# Patient Record
Sex: Male | Born: 1950 | Race: White | Hispanic: No | Marital: Married | Smoking: Former smoker
Health system: Southern US, Community
[De-identification: ages and names within clinical notes are randomized; demographics above are authoritative.]

## PROBLEM LIST (undated history)

## (undated) DIAGNOSIS — E785 Hyperlipidemia, unspecified: Secondary | ICD-10-CM

## (undated) DIAGNOSIS — I10 Essential (primary) hypertension: Secondary | ICD-10-CM

## (undated) DIAGNOSIS — E119 Type 2 diabetes mellitus without complications: Secondary | ICD-10-CM

## (undated) DIAGNOSIS — C801 Malignant (primary) neoplasm, unspecified: Secondary | ICD-10-CM

## (undated) DIAGNOSIS — G4730 Sleep apnea, unspecified: Secondary | ICD-10-CM

## (undated) DIAGNOSIS — K259 Gastric ulcer, unspecified as acute or chronic, without hemorrhage or perforation: Secondary | ICD-10-CM

## (undated) DIAGNOSIS — E039 Hypothyroidism, unspecified: Secondary | ICD-10-CM

## (undated) DIAGNOSIS — Z87442 Personal history of urinary calculi: Secondary | ICD-10-CM

## (undated) DIAGNOSIS — M1990 Unspecified osteoarthritis, unspecified site: Secondary | ICD-10-CM

## (undated) HISTORY — DX: Sleep apnea, unspecified: G47.30

## (undated) HISTORY — PX: REPLACEMENT TOTAL KNEE: SUR1224

## (undated) HISTORY — DX: Essential (primary) hypertension: I10

## (undated) HISTORY — PX: HERNIA REPAIR: SHX51

## (undated) HISTORY — DX: Hyperlipidemia, unspecified: E78.5

## (undated) HISTORY — PX: TOTAL SHOULDER REPLACEMENT: SUR1217

## (undated) HISTORY — DX: Type 2 diabetes mellitus without complications: E11.9

---

## 2015-02-03 DIAGNOSIS — E780 Pure hypercholesterolemia: Secondary | ICD-10-CM | POA: Diagnosis not present

## 2015-02-03 DIAGNOSIS — E1365 Other specified diabetes mellitus with hyperglycemia: Secondary | ICD-10-CM | POA: Diagnosis not present

## 2015-02-03 DIAGNOSIS — I10 Essential (primary) hypertension: Secondary | ICD-10-CM | POA: Diagnosis not present

## 2015-02-03 DIAGNOSIS — E039 Hypothyroidism, unspecified: Secondary | ICD-10-CM | POA: Diagnosis not present

## 2015-02-14 DIAGNOSIS — B351 Tinea unguium: Secondary | ICD-10-CM | POA: Diagnosis not present

## 2015-02-14 DIAGNOSIS — Q667 Congenital pes cavus: Secondary | ICD-10-CM | POA: Diagnosis not present

## 2015-02-14 DIAGNOSIS — E119 Type 2 diabetes mellitus without complications: Secondary | ICD-10-CM | POA: Diagnosis not present

## 2015-03-14 DIAGNOSIS — B351 Tinea unguium: Secondary | ICD-10-CM | POA: Diagnosis not present

## 2015-03-14 DIAGNOSIS — Q667 Congenital pes cavus: Secondary | ICD-10-CM | POA: Diagnosis not present

## 2015-05-03 DIAGNOSIS — B351 Tinea unguium: Secondary | ICD-10-CM | POA: Diagnosis not present

## 2015-05-03 DIAGNOSIS — B353 Tinea pedis: Secondary | ICD-10-CM | POA: Diagnosis not present

## 2015-05-03 DIAGNOSIS — M722 Plantar fascial fibromatosis: Secondary | ICD-10-CM | POA: Diagnosis not present

## 2015-05-05 DIAGNOSIS — E780 Pure hypercholesterolemia: Secondary | ICD-10-CM | POA: Diagnosis not present

## 2015-05-05 DIAGNOSIS — E119 Type 2 diabetes mellitus without complications: Secondary | ICD-10-CM | POA: Diagnosis not present

## 2015-05-05 DIAGNOSIS — I10 Essential (primary) hypertension: Secondary | ICD-10-CM | POA: Diagnosis not present

## 2015-05-05 DIAGNOSIS — E1365 Other specified diabetes mellitus with hyperglycemia: Secondary | ICD-10-CM | POA: Diagnosis not present

## 2015-05-18 DIAGNOSIS — B353 Tinea pedis: Secondary | ICD-10-CM | POA: Diagnosis not present

## 2015-05-18 DIAGNOSIS — B351 Tinea unguium: Secondary | ICD-10-CM | POA: Diagnosis not present

## 2015-05-18 DIAGNOSIS — M722 Plantar fascial fibromatosis: Secondary | ICD-10-CM | POA: Diagnosis not present

## 2015-06-13 DIAGNOSIS — M722 Plantar fascial fibromatosis: Secondary | ICD-10-CM | POA: Diagnosis not present

## 2015-09-19 DIAGNOSIS — H2511 Age-related nuclear cataract, right eye: Secondary | ICD-10-CM | POA: Diagnosis not present

## 2015-09-19 DIAGNOSIS — E119 Type 2 diabetes mellitus without complications: Secondary | ICD-10-CM | POA: Diagnosis not present

## 2015-09-19 DIAGNOSIS — Z961 Presence of intraocular lens: Secondary | ICD-10-CM | POA: Diagnosis not present

## 2015-09-26 DIAGNOSIS — E039 Hypothyroidism, unspecified: Secondary | ICD-10-CM | POA: Diagnosis not present

## 2015-09-26 DIAGNOSIS — Z23 Encounter for immunization: Secondary | ICD-10-CM | POA: Diagnosis not present

## 2015-09-26 DIAGNOSIS — Z0000 Encounter for general adult medical examination without abnormal findings: Secondary | ICD-10-CM | POA: Diagnosis not present

## 2015-09-26 DIAGNOSIS — Z6841 Body Mass Index (BMI) 40.0 and over, adult: Secondary | ICD-10-CM | POA: Diagnosis not present

## 2015-09-26 DIAGNOSIS — I10 Essential (primary) hypertension: Secondary | ICD-10-CM | POA: Diagnosis not present

## 2015-09-26 DIAGNOSIS — Z1211 Encounter for screening for malignant neoplasm of colon: Secondary | ICD-10-CM | POA: Diagnosis not present

## 2015-09-26 DIAGNOSIS — Z794 Long term (current) use of insulin: Secondary | ICD-10-CM | POA: Diagnosis not present

## 2015-09-26 DIAGNOSIS — E119 Type 2 diabetes mellitus without complications: Secondary | ICD-10-CM | POA: Diagnosis not present

## 2015-09-27 ENCOUNTER — Other Ambulatory Visit: Payer: Self-pay | Admitting: Family Medicine

## 2015-09-27 ENCOUNTER — Other Ambulatory Visit (HOSPITAL_COMMUNITY): Payer: Self-pay | Admitting: Family Medicine

## 2015-09-27 DIAGNOSIS — Z122 Encounter for screening for malignant neoplasm of respiratory organs: Secondary | ICD-10-CM

## 2015-09-27 DIAGNOSIS — Z136 Encounter for screening for cardiovascular disorders: Secondary | ICD-10-CM

## 2015-09-27 DIAGNOSIS — F17211 Nicotine dependence, cigarettes, in remission: Secondary | ICD-10-CM

## 2015-10-06 DIAGNOSIS — Z1211 Encounter for screening for malignant neoplasm of colon: Secondary | ICD-10-CM | POA: Diagnosis not present

## 2015-10-18 ENCOUNTER — Ambulatory Visit (HOSPITAL_COMMUNITY): Payer: Medicare Other

## 2015-11-02 DIAGNOSIS — M722 Plantar fascial fibromatosis: Secondary | ICD-10-CM | POA: Diagnosis not present

## 2015-11-02 DIAGNOSIS — Q667 Congenital pes cavus: Secondary | ICD-10-CM | POA: Diagnosis not present

## 2015-11-02 DIAGNOSIS — E1351 Other specified diabetes mellitus with diabetic peripheral angiopathy without gangrene: Secondary | ICD-10-CM | POA: Diagnosis not present

## 2015-11-07 ENCOUNTER — Ambulatory Visit (HOSPITAL_COMMUNITY)
Admission: RE | Admit: 2015-11-07 | Discharge: 2015-11-07 | Disposition: A | Discharge status: Home / Self Care | Payer: Medicare Other | Source: Ambulatory Visit | Attending: Family Medicine | Admitting: Family Medicine

## 2015-11-07 DIAGNOSIS — Z122 Encounter for screening for malignant neoplasm of respiratory organs: Secondary | ICD-10-CM | POA: Diagnosis not present

## 2015-11-07 DIAGNOSIS — R911 Solitary pulmonary nodule: Secondary | ICD-10-CM | POA: Insufficient documentation

## 2015-11-07 DIAGNOSIS — Z87891 Personal history of nicotine dependence: Secondary | ICD-10-CM | POA: Diagnosis not present

## 2015-11-07 DIAGNOSIS — F17211 Nicotine dependence, cigarettes, in remission: Secondary | ICD-10-CM

## 2015-11-13 ENCOUNTER — Ambulatory Visit
Admission: RE | Admit: 2015-11-13 | Discharge: 2015-11-13 | Disposition: A | Discharge status: Home / Self Care | Payer: Medicare Other | Source: Ambulatory Visit | Attending: Family Medicine | Admitting: Family Medicine

## 2015-11-13 DIAGNOSIS — Z136 Encounter for screening for cardiovascular disorders: Secondary | ICD-10-CM

## 2015-11-14 DIAGNOSIS — G4733 Obstructive sleep apnea (adult) (pediatric): Secondary | ICD-10-CM | POA: Diagnosis not present

## 2015-11-30 DIAGNOSIS — E119 Type 2 diabetes mellitus without complications: Secondary | ICD-10-CM | POA: Diagnosis not present

## 2015-11-30 DIAGNOSIS — Z5181 Encounter for therapeutic drug level monitoring: Secondary | ICD-10-CM | POA: Diagnosis not present

## 2015-11-30 DIAGNOSIS — Z6841 Body Mass Index (BMI) 40.0 and over, adult: Secondary | ICD-10-CM | POA: Diagnosis not present

## 2015-11-30 DIAGNOSIS — Z713 Dietary counseling and surveillance: Secondary | ICD-10-CM | POA: Diagnosis not present

## 2016-04-11 ENCOUNTER — Ambulatory Visit: Payer: Medicare Other | Admitting: *Deleted

## 2016-04-23 ENCOUNTER — Ambulatory Visit: Payer: Medicare Other | Admitting: Dietician

## 2016-05-09 ENCOUNTER — Encounter: Payer: Self-pay | Admitting: Dietician

## 2016-05-09 ENCOUNTER — Encounter: Payer: Medicare Other | Attending: Family Medicine | Admitting: Dietician

## 2016-05-09 VITALS — Ht 70.0 in | Wt 304.4 lb

## 2016-05-09 DIAGNOSIS — E119 Type 2 diabetes mellitus without complications: Secondary | ICD-10-CM | POA: Diagnosis not present

## 2016-05-09 NOTE — Patient Instructions (Signed)
Goals:  Follow Diabetes Meal Plan as instructed  Eat 3 meals and 2 snacks, every 3-5 hrs  Limit carbohydrate intake to 45-60 grams carbohydrate/meal  Limit carbohydrate intake to 0-15 grams carbohydrate/snack  Aim to have half of your plate vegetables   Protein: size of the palm of your hand, Starch: what you hold in your hand  Add lean protein foods to meals/snacks  Monitor glucose levels as instructed by your doctor (try once a day and look for patterns)  Continue to 30 mins or of physical activity daily

## 2016-05-09 NOTE — Progress Notes (Signed)
Diabetes Self-Management Education  Visit Type: First/Initial  Appt. Start Time: 210 Appt. End Time: 330  05/09/2016  Dean Zimmerman, identified by name and date of birth, is a 65 y.o. male with a diagnosis of Diabetes: Type 2.   ASSESSMENT  Height 5\' 10"  (1.778 m), weight 304 lb 6.4 oz (138.075 kg). Body mass index is 43.68 kg/(m^2).      Diabetes Self-Management Education - 05/09/16 1604    Psychosocial Assessment   Learning Readiness Ready   Pre-Education Assessment   Patient understands the diabetes disease and treatment process. Needs Instruction   Patient understands incorporating nutritional management into lifestyle. Needs Instruction   Patient undertands incorporating physical activity into lifestyle. Needs Instruction   Patient understands using medications safely. Needs Instruction   Patient understands monitoring blood glucose, interpreting and using results Needs Review   Patient understands prevention, detection, and treatment of acute complications. Needs Review   Patient understands prevention, detection, and treatment of chronic complications. Needs Review   Patient understands how to develop strategies to address psychosocial issues. Demonstrates understanding / competency   Patient understands how to develop strategies to promote health/change behavior. Demonstrates understanding / competency   Patient Education   Disease state  Definition of diabetes, type 1 and 2, and the diagnosis of diabetes;Factors that contribute to the development of diabetes   Nutrition management  Food label reading, portion sizes and measuring food.   Physical activity and exercise  Role of exercise on diabetes management, blood pressure control and cardiac health.   Medications Reviewed patients medication for diabetes, action, purpose, timing of dose and side effects.   Monitoring Interpreting lab values - A1C, lipid, urine microalbumina.;Yearly dilated eye exam;Daily foot  exams;Identified appropriate SMBG and/or A1C goals.   Acute complications Taught treatment of hypoglycemia - the 15 rule.   Chronic complications Relationship between chronic complications and blood glucose control   Psychosocial adjustment Role of stress on diabetes   Individualized Goals (developed by patient)   Nutrition Follow meal plan discussed   Physical Activity Exercise 5-7 days per week   Medications take my medication as prescribed   Monitoring  test my blood glucose as discussed   Reducing Risk examine blood glucose patterns;treat hypoglycemia with 15 grams of carbs if blood glucose less than 70mg /dL   Post-Education Assessment   Patient understands the diabetes disease and treatment process. Demonstrates understanding / competency   Patient understands incorporating nutritional management into lifestyle. Demonstrates understanding / competency   Patient undertands incorporating physical activity into lifestyle. Demonstrates understanding / competency   Patient understands using medications safely. Demonstrates understanding / competency   Patient understands monitoring blood glucose, interpreting and using results Demonstrates understanding / competency   Patient understands prevention, detection, and treatment of acute complications. Demonstrates understanding / competency   Patient understands prevention, detection, and treatment of chronic complications. Demonstrates understanding / competency   Patient understands how to develop strategies to address psychosocial issues. Demonstrates understanding / competency   Patient understands how to develop strategies to promote health/change behavior. Demonstrates understanding / competency   Outcomes   Expected Outcomes Demonstrated interest in learning. Expect positive outcomes   Future DMSE PRN   Program Status Completed      Individualized Plan for Diabetes Self-Management Training:   Learning Objective:  Patient will have a  greater understanding of diabetes self-management. Patient education plan is to attend individual and/or group sessions per assessed needs and concerns.   Plan:   Patient Instructions  Goals:  Follow Diabetes Meal Plan as instructed  Eat 3 meals and 2 snacks, every 3-5 hrs  Limit carbohydrate intake to 45-60 grams carbohydrate/meal  Limit carbohydrate intake to 0-15 grams carbohydrate/snack  Aim to have half of your plate vegetables   Protein: size of the palm of your hand, Starch: what you hold in your hand  Add lean protein foods to meals/snacks  Monitor glucose levels as instructed by your doctor (try once a day and look for patterns)  Continue to 30 mins or of physical activity daily    Expected Outcomes:  Demonstrated interest in learning. Expect positive outcomes  Education material provided: Living Well with Diabetes, Meal plan card and Snack sheet  If problems or questions, patient to contact team via:  Phone  Future DSME appointment: PRN

## 2016-09-24 DIAGNOSIS — E119 Type 2 diabetes mellitus without complications: Secondary | ICD-10-CM | POA: Diagnosis not present

## 2016-09-24 DIAGNOSIS — H524 Presbyopia: Secondary | ICD-10-CM | POA: Diagnosis not present

## 2016-09-24 DIAGNOSIS — H2511 Age-related nuclear cataract, right eye: Secondary | ICD-10-CM | POA: Diagnosis not present

## 2016-11-07 ENCOUNTER — Other Ambulatory Visit: Payer: Self-pay | Admitting: Family Medicine

## 2016-11-07 DIAGNOSIS — R911 Solitary pulmonary nodule: Secondary | ICD-10-CM

## 2016-11-27 ENCOUNTER — Inpatient Hospital Stay: Admission: RE | Admit: 2016-11-27 | Payer: Medicare Other | Source: Ambulatory Visit

## 2016-12-10 ENCOUNTER — Ambulatory Visit
Admission: RE | Admit: 2016-12-10 | Discharge: 2016-12-10 | Disposition: A | Discharge status: Home / Self Care | Payer: Medicare Other | Source: Ambulatory Visit | Attending: Family Medicine | Admitting: Family Medicine

## 2016-12-10 DIAGNOSIS — R911 Solitary pulmonary nodule: Secondary | ICD-10-CM

## 2017-09-22 ENCOUNTER — Ambulatory Visit
Admission: RE | Admit: 2017-09-22 | Discharge: 2017-09-22 | Disposition: A | Discharge status: Home / Self Care | Payer: Worker's Compensation | Source: Ambulatory Visit | Attending: Orthopedic Surgery | Admitting: Orthopedic Surgery

## 2017-09-22 ENCOUNTER — Other Ambulatory Visit: Payer: Self-pay | Admitting: Orthopedic Surgery

## 2017-09-22 DIAGNOSIS — Z01818 Encounter for other preprocedural examination: Secondary | ICD-10-CM

## 2018-01-07 ENCOUNTER — Ambulatory Visit
Admission: RE | Admit: 2018-01-07 | Discharge: 2018-01-07 | Disposition: A | Discharge status: Home / Self Care | Payer: Worker's Compensation | Source: Ambulatory Visit | Attending: Orthopedic Surgery | Admitting: Orthopedic Surgery

## 2018-01-07 ENCOUNTER — Other Ambulatory Visit: Payer: Self-pay | Admitting: Orthopedic Surgery

## 2018-01-07 DIAGNOSIS — M25511 Pain in right shoulder: Secondary | ICD-10-CM

## 2018-10-19 ENCOUNTER — Other Ambulatory Visit (HOSPITAL_COMMUNITY): Payer: Self-pay

## 2018-10-26 ENCOUNTER — Inpatient Hospital Stay: Admit: 2018-10-26 | Payer: Self-pay | Admitting: Orthopedic Surgery

## 2018-10-26 SURGERY — ARTHROPLASTY, KNEE, TOTAL
Anesthesia: Choice | Site: Knee | Laterality: Left

## 2019-12-15 ENCOUNTER — Ambulatory Visit
Admission: RE | Admit: 2019-12-15 | Discharge: 2019-12-15 | Disposition: A | Discharge status: Home / Self Care | Payer: Self-pay | Source: Ambulatory Visit | Attending: Orthopedic Surgery | Admitting: Orthopedic Surgery

## 2019-12-15 ENCOUNTER — Other Ambulatory Visit: Payer: Self-pay | Admitting: Orthopedic Surgery

## 2019-12-15 DIAGNOSIS — M25111 Fistula, right shoulder: Secondary | ICD-10-CM

## 2019-12-15 DIAGNOSIS — M19011 Primary osteoarthritis, right shoulder: Secondary | ICD-10-CM

## 2020-01-28 ENCOUNTER — Ambulatory Visit: Payer: PRIVATE HEALTH INSURANCE

## 2020-02-05 ENCOUNTER — Ambulatory Visit: Payer: Medicare Other

## 2020-02-18 ENCOUNTER — Ambulatory Visit: Payer: PRIVATE HEALTH INSURANCE

## 2020-10-09 ENCOUNTER — Other Ambulatory Visit: Payer: Self-pay | Admitting: Family Medicine

## 2020-10-09 DIAGNOSIS — Z122 Encounter for screening for malignant neoplasm of respiratory organs: Secondary | ICD-10-CM

## 2020-10-09 DIAGNOSIS — Z87891 Personal history of nicotine dependence: Secondary | ICD-10-CM

## 2020-10-25 ENCOUNTER — Other Ambulatory Visit: Payer: Self-pay

## 2020-10-25 ENCOUNTER — Ambulatory Visit
Admission: RE | Admit: 2020-10-25 | Discharge: 2020-10-25 | Disposition: A | Discharge status: Home / Self Care | Payer: Medicare Other | Source: Ambulatory Visit | Attending: Family Medicine | Admitting: Family Medicine

## 2020-10-25 DIAGNOSIS — Z87891 Personal history of nicotine dependence: Secondary | ICD-10-CM

## 2020-10-25 DIAGNOSIS — Z122 Encounter for screening for malignant neoplasm of respiratory organs: Secondary | ICD-10-CM

## 2020-12-19 IMAGING — CT CT CHEST LUNG CANCER SCREENING LOW DOSE W/O CM
1 series · 15 of 31 positions shown, 19 images · non-contrast
Comparison: 12/10/2016 diagnostic chest CT. Lung cancer screening
CT of 11/07/2015.

CLINICAL DATA: Sixty pack-year smoking history, quitting 10 years
ago.

EXAM:
CT CHEST WITHOUT CONTRAST LOW-DOSE FOR LUNG CANCER SCREENING
TECHNIQUE: Multidetector CT imaging of the chest was performed following the
standard protocol without IV contrast.

[Series 2: ldct screen -soft · axial · 0.94mm/px · z∈[-358,-28]mm · 15 of 72 slices shown, 19 images]
[im 3/72  mediastinal]
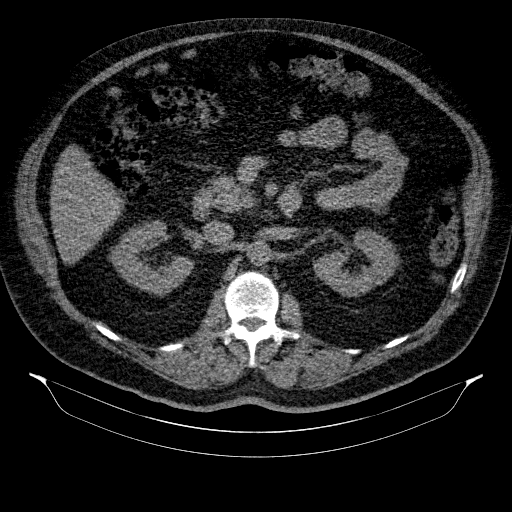
[im 3/72  lung]
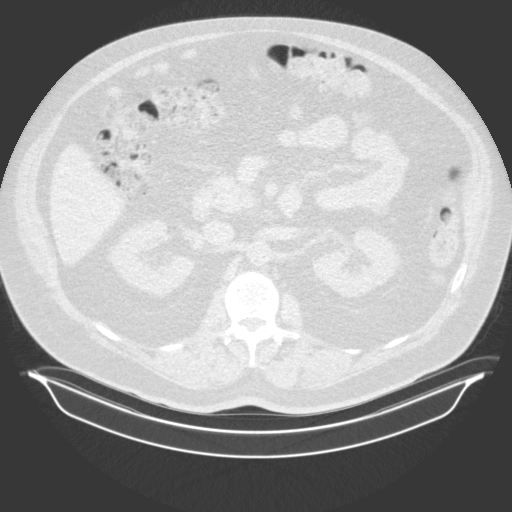
[im 8/72  lung]
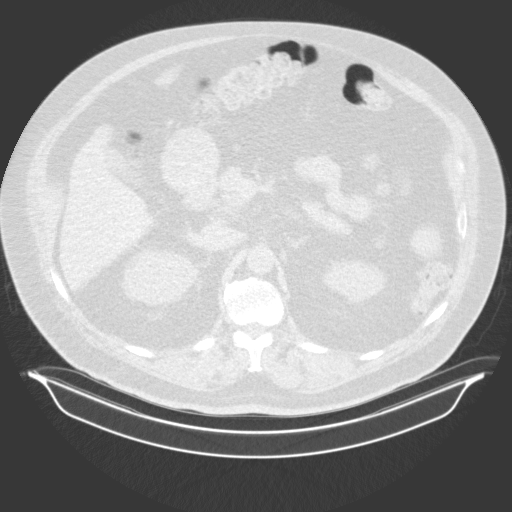
[im 14/72  lung]
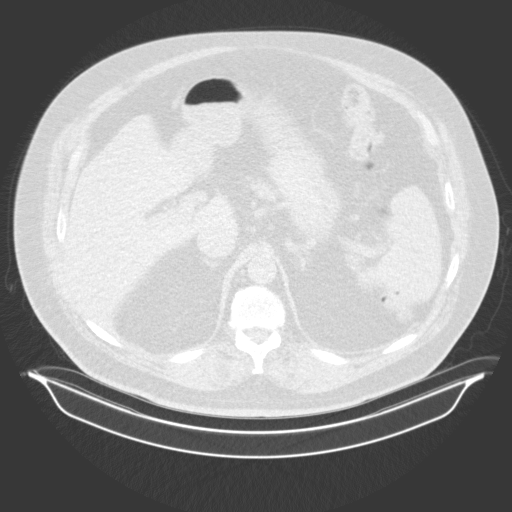
[im 16/72  lung]
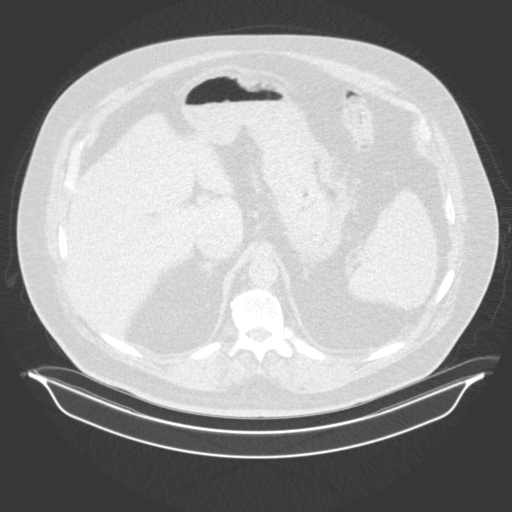
[im 22/72  mediastinal]
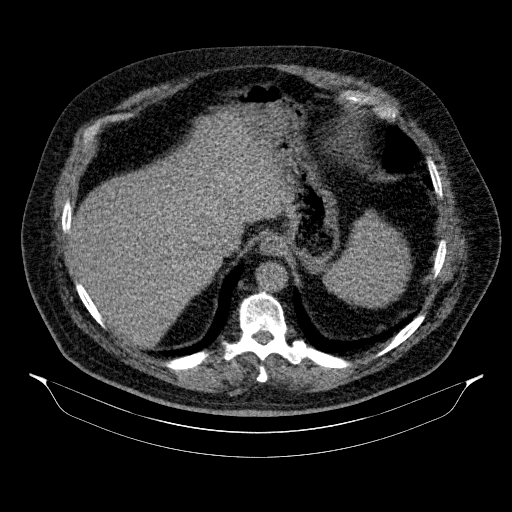
[im 22/72  lung]
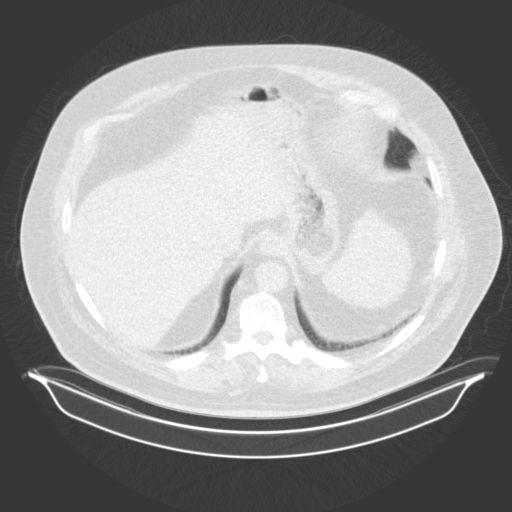
[im 27/72  lung]
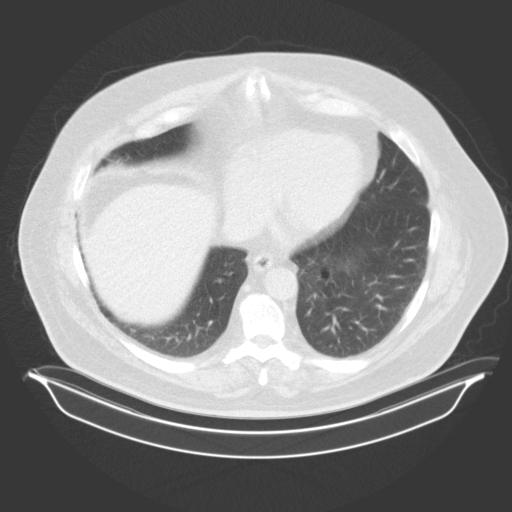
[im 32/72  lung]
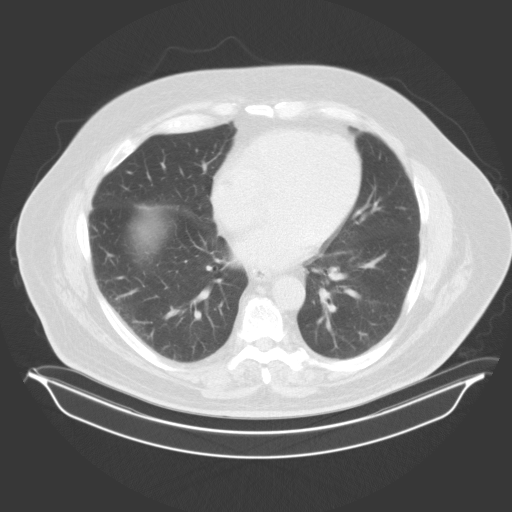
[im 37/72  lung]
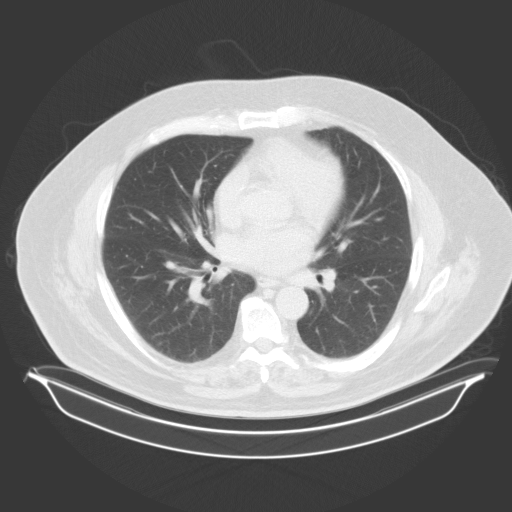
[im 40/72  mediastinal]
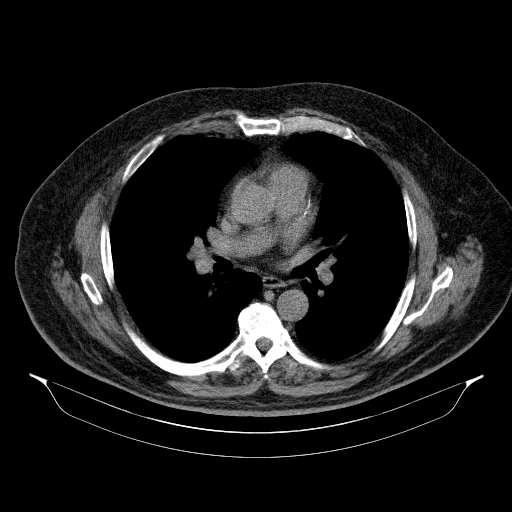
[im 40/72  lung]
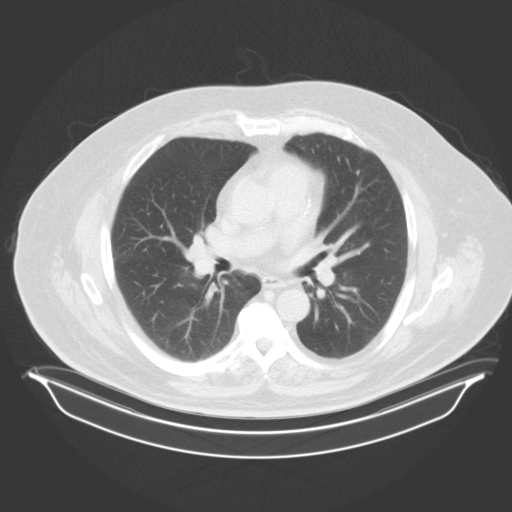
[im 45/72  lung]
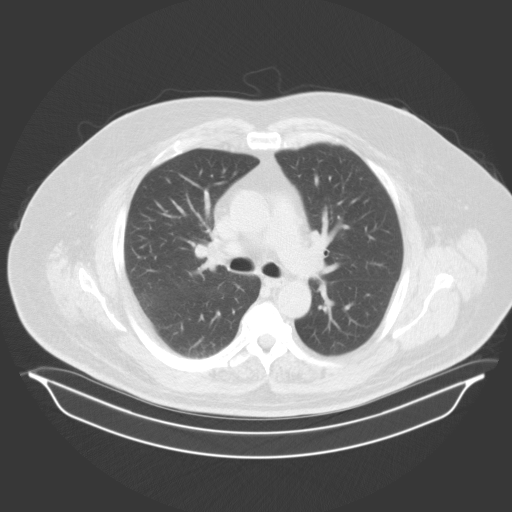
[im 50/72  lung]
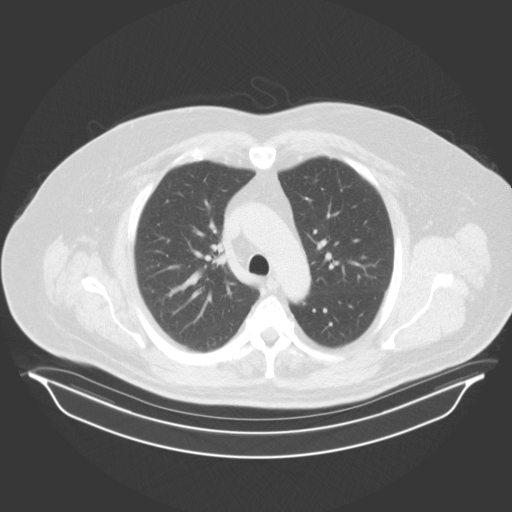
[im 56/72  lung]
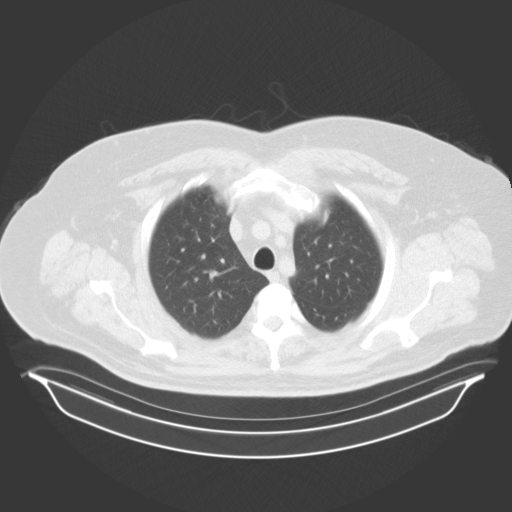
[im 58/72  mediastinal]
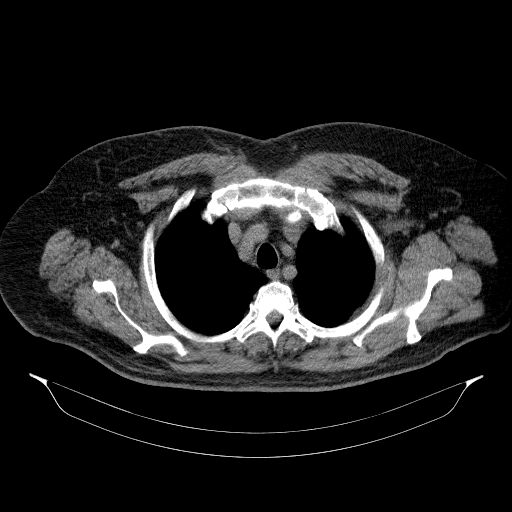
[im 58/72  lung]
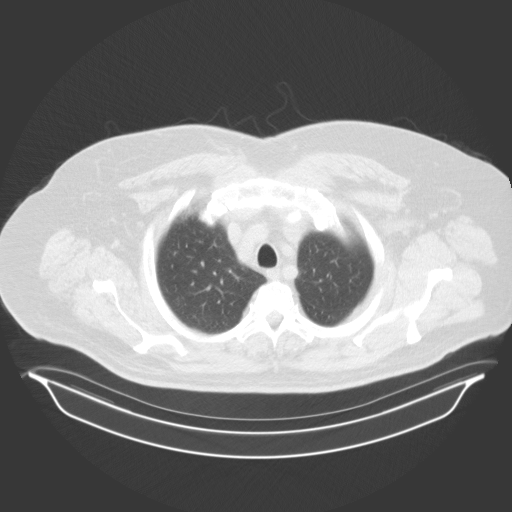
[im 64/72  lung]
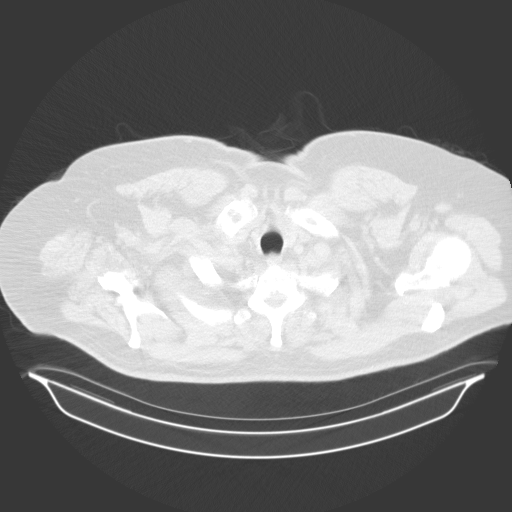
[im 69/72  lung]
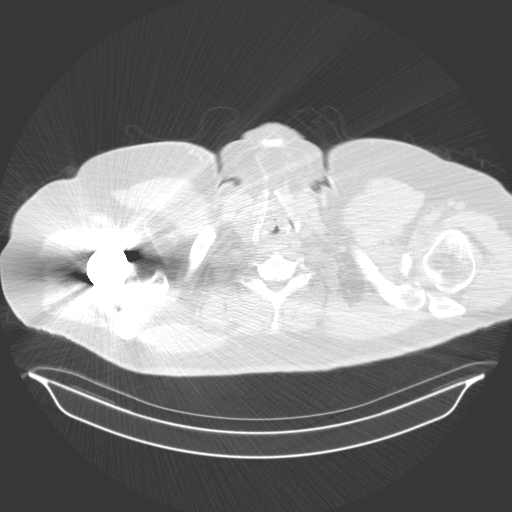

[15 of 31 positions shown; findings below may reference images not displayed]

FINDINGS: Cardiovascular: Aortic atherosclerosis. Normal heart size, without
pericardial effusion. Multivessel coronary artery atherosclerosis.

Mediastinum/Nodes: No mediastinal or definite hilar adenopathy,
given limitations of unenhanced CT.

Lungs/Pleura: No pleural fluid. Mild centrilobular emphysema.
Isolated right middle lobe pulmonary nodule is unchanged at volume
derived equivalent diameter 2.4 mm.

Upper Abdomen: Normal imaged portions of the liver, spleen, stomach,
pancreas, gallbladder, biliary tract, adrenal glands, kidneys.

Musculoskeletal: Right shoulder arthroplasty. Upper thoracic
spondylosis.
IMPRESSION: 1. Lung-RADS 2, benign appearance or behavior. Continue annual
screening with low-dose chest CT without contrast in 12 months.
2. Aortic Atherosclerosis (E3QMW-T8C.C) and Emphysema (E3QMW-T58.M).

## 2021-05-28 ENCOUNTER — Telehealth (HOSPITAL_COMMUNITY): Payer: Self-pay

## 2021-05-28 ENCOUNTER — Other Ambulatory Visit: Payer: Self-pay

## 2021-05-28 ENCOUNTER — Encounter (HOSPITAL_COMMUNITY): Payer: Self-pay

## 2021-05-28 ENCOUNTER — Ambulatory Visit (HOSPITAL_COMMUNITY)
Admission: EM | Admit: 2021-05-28 | Discharge: 2021-05-28 | Disposition: A | Discharge status: Home / Self Care | Payer: Medicare Other

## 2021-05-28 DIAGNOSIS — E1169 Type 2 diabetes mellitus with other specified complication: Secondary | ICD-10-CM

## 2021-05-28 DIAGNOSIS — Z8719 Personal history of other diseases of the digestive system: Secondary | ICD-10-CM | POA: Diagnosis not present

## 2021-05-28 DIAGNOSIS — R112 Nausea with vomiting, unspecified: Secondary | ICD-10-CM | POA: Diagnosis not present

## 2021-05-28 DIAGNOSIS — A059 Bacterial foodborne intoxication, unspecified: Secondary | ICD-10-CM

## 2021-05-28 DIAGNOSIS — Z794 Long term (current) use of insulin: Secondary | ICD-10-CM

## 2021-05-28 MED ORDER — ONDANSETRON 8 MG PO TBDP: 8.0000 mg | ORAL_TABLET | Freq: Three times a day (TID) | ORAL | 0 refills | Status: DC | PRN

## 2021-05-28 MED ORDER — ONDANSETRON 4 MG PO TBDP
8.0000 mg | ORAL_TABLET | Freq: Once | ORAL | Status: AC
  Administered 2021-05-28: 8 mg via ORAL

## 2021-05-28 MED ORDER — ONDANSETRON 4 MG PO TBDP
ORAL_TABLET | ORAL | Status: AC
  Filled 2021-05-28: qty 2

## 2021-05-28 NOTE — ED Provider Notes (Addendum)
[facility]    CSN: [id number] Arrival date & time: 05/28/21  1528      History   Chief Complaint Chief Complaint  Patient presents with  . Abdominal Pain  . Emesis  . Diarrhea    HPI Dean Zimmerman is a 70 y.o. male presenting with GI symptoms x1 day following eating at restaurant. Medical history diabetes, hypertension, hyperlipidemia, sleep apnea, SBO.  States he has had 8 episodes of vomiting and 3 episodes of diarrhea in the last 24 hours.  Nausea.  Able to keep some fluids and food down.  Pepto-Bismol providing little relief.  Generalized crampy abdominal pain.  States he is concerned that he has another bowel obstruction.  Last bowel movement was 10 minutes ago and was normal. Does state vomit had a brown appearance following taking pepto bismal. Denies hematemesis, vomiting of fecal matter, melena, hematochezia.  Denies URI symptoms including fever/chills, cough, congestion, body aches.  Denies chest pain, shortness of breath, dizziness, weakness.  For diabetes, he is taking medications as directed.  Sugars running 150s today nonfasting.  HPI  Past Medical History:  Diagnosis Date  . Diabetes mellitus without complication ([place])   . Hyperlipidemia   . Hypertension   . Sleep apnea     There are no problems to display for this patient.   History reviewed. No pertinent surgical history.     Home Medications    Prior to Admission medications   Medication Sig Start Date End Date Taking? Authorizing Provider  celecoxib (CELEBREX) 200 MG capsule Take 200 mg by mouth 2 (two) times daily.   Yes [provider]  escitalopram (LEXAPRO) 10 MG tablet Take 10 mg by mouth daily.   Yes [provider]  Latanoprost 0.005 % EMUL Apply to eye.   Yes [provider]  metFORMIN (GLUCOPHAGE) 1000 MG tablet Take 1,000 mg by mouth 2 (two) times daily with a meal.   Yes [provider]  omeprazole (PRILOSEC) 40 MG capsule Take 40 mg by mouth  daily.   Yes [provider]  ondansetron (ZOFRAN ODT) 8 MG disintegrating tablet Take 1 tablet (8 mg total) by mouth every 8 (eight) hours as needed for nausea or vomiting. 05/28/21  Yes Hazel Sams, PA-C  Semaglutide (OZEMPIC, 0.25 OR 0.5 MG/DOSE, [place]) Inject into the skin.   Yes [provider]  aspirin 81 MG tablet Take 81 mg by mouth daily.    [provider]  atorvastatin (LIPITOR) 20 MG tablet Take 20 mg by mouth daily.    [provider]  Cholecalciferol (VITAMIN D) 2000 units CAPS Take 4,000 Units by mouth.    [provider]  enalapril (VASOTEC) 10 MG tablet Take 10 mg by mouth daily.    [provider]  glipiZIDE (GLUCOTROL) 10 MG tablet Take 10 mg by mouth daily before breakfast.    [provider]  insulin glargine (LANTUS) 100 UNIT/ML injection Inject 55 Units into the skin at bedtime.     [provider]  levothyroxine (SYNTHROID, LEVOTHROID) 150 MCG tablet Take 150 mcg by mouth daily before breakfast.    [provider]  Liraglutide (VICTOZA) 18 MG/3ML SOPN Inject into the skin.    [provider]  pioglitazone (ACTOS) 30 MG tablet Take 30 mg by mouth daily.    [provider]  Tavaborole (KERYDIN) 5 % SOLN Apply topically.    [provider]    Family History Family History  Family history unknown: Yes  Social History     Allergies   Hydrocodone and Oxycontin [oxycodone hcl]   Review of Systems Review of Systems  Constitutional: Negative for appetite change, chills, diaphoresis, fever and unexpected weight change.  HENT: Negative for congestion, ear pain, sinus pressure, sinus pain, sneezing, sore throat and trouble swallowing.   Respiratory: Negative for cough, chest tightness and shortness of breath.   Cardiovascular: Negative for chest pain.  Gastrointestinal: Positive for abdominal pain, diarrhea, nausea and vomiting. Negative for abdominal distention,  anal bleeding, blood in stool, constipation and rectal pain.  Genitourinary: Negative for dysuria, flank pain, frequency and urgency.  Musculoskeletal: Negative for back pain and myalgias.  Neurological: Negative for dizziness, light-headedness and headaches.  All other systems reviewed and are negative.    Physical Exam Triage Vital Signs ED Triage Vitals  Enc Vitals Group     BP      Pulse      Resp      Temp      Temp src      SpO2      Weight      Height      Head Circumference      Peak Flow      Pain Score      Pain Loc      Pain Edu?      Excl. in [place]?    No data found.  Updated Vital Signs BP (!) 146/72 (BP Location: Right Arm)   Pulse 70   Temp 99 F (37.2 C) (Oral)   Resp 17   SpO2 98%   Visual Acuity Right Eye Distance:   Left Eye Distance:   Bilateral Distance:    Right Eye Near:   Left Eye Near:    Bilateral Near:     Physical Exam Vitals reviewed.  Constitutional:      General: He is not in acute distress.    Appearance: Normal appearance. He is obese. He is not ill-appearing.  HENT:     Head: Normocephalic and atraumatic.     Mouth/Throat:     Mouth: Mucous membranes are moist.     Comments: Moist mucous membranes Eyes:     Extraocular Movements: Extraocular movements intact.     Pupils: Pupils are equal, round, and reactive to light.  Cardiovascular:     Rate and Rhythm: Normal rate and regular rhythm.     Heart sounds: Normal heart sounds.  Pulmonary:     Effort: Pulmonary effort is normal.     Breath sounds: Normal breath sounds. No wheezing, rhonchi or rales.  Abdominal:     General: Bowel sounds are increased. There is no distension.     Palpations: Abdomen is soft. There is no mass.     Tenderness: There is generalized abdominal tenderness. There is no right CVA tenderness, left CVA tenderness, guarding or rebound. Negative signs include Murphy's sign, Rovsing's sign and McBurney's sign.     Hernia: No hernia is present.      Comments: BS full throughout. Generalized TTP. No focal pain. No abdominal mass.  Skin:    General: Skin is warm.     Capillary Refill: Capillary refill takes less than 2 seconds.     Comments: Good skin turgor  Neurological:     General: No focal deficit present.     Mental Status: He is alert and oriented to person, place, and time.  Psychiatric:        Mood and Affect: Mood normal.  Behavior: Behavior normal.      UC Treatments / Results  Labs (all labs ordered are listed, but only abnormal results are displayed) Labs Reviewed - No data to display  EKG   Radiology No results found.  Procedures Procedures (including critical care time)  Medications Ordered in UC Medications  ondansetron (ZOFRAN-ODT) disintegrating tablet 8 mg (has no administration in time range)    Initial Impression / Assessment and Plan / UC Course  I have reviewed the triage vital signs and the nursing notes.  Pertinent labs & imaging results that were available during my care of the patient were reviewed by me and considered in my medical decision making (see chart for details).     This patient is a 70 year old male presenting with nausea and vomiting following eating at restaurant.  Suspect food poisoning.  He is afebrile and nontachycardic, generalized abdominal pain.  Appears well-hydrated.  This patient does have a history of small bowel obstruction few years ago.  Bowel sounds are full throughout today, with no abdominal mass. He is having frequent diarrhea. No hematemesis or vomiting of fecal matter. I have low suspicion for bowel obstruction. Will treat conservatively with zofran and good hydration.  Also provided 8 mg Zofran ODT today.  STRICT return precautions discussed- symptoms worsen AT ALL, head straight to ED. patient and wife verbalized understanding and agreement multiple times.   For diabetes, this is currently at goal.  Continue current regimen.  Sugars running 150s  nonfasting today.  Patient called stating that the Zofran is not covered by their insurance, I sent this to another pharmacy that will be cheaper out-of-pocket.  Coding this a Level 4 based on time as I spent over 40 minutes with patient and his wife answering multiple questions, discussing extensive medical history, performing exam, discussing treatment plan, discussing ED return precautions.   Final Clinical Impressions(s) / UC Diagnoses   Final diagnoses:  Food poisoning  Intractable vomiting with nausea, unspecified vomiting type  History of small bowel obstruction  Type 2 diabetes mellitus with other specified complication, with long-term current use of insulin ([place])     Discharge Instructions     -Take the Zofran (ondansetron) up to 3 times daily for nausea and vomiting. Dissolve one pill under your tongue or between your teeth and your cheek. -Drink plenty of fluids and eat a bland diet as tolerated. -I suspect that you have food poisoning based on your symptoms.  Given your history of a bowel obstruction, it is important that you pay attention to your symptoms and seek additional immediate medical attention if they get worse.  Signs of a bowel obstruction include you stop having bowel movements, you develop severe abdominal pain, you start vomiting uncontrollably or vomiting of fecal matter.  Head straight to the emergency department if you develop these symptoms, or new symptoms like dizziness, chest pain, shortness of breath.    ED Prescriptions    Medication Sig Dispense Auth. Provider   ondansetron (ZOFRAN ODT) 8 MG disintegrating tablet Take 1 tablet (8 mg total) by mouth every 8 (eight) hours as needed for nausea or vomiting. 20 tablet Hazel Sams, PA-C     PDMP not reviewed this encounter.   Hazel Sams, PA-C 05/28/21 1620    Hazel Sams, PA-C 05/28/21 1653    Hazel Sams, PA-C 05/28/21 1730

## 2021-05-28 NOTE — Discharge Instructions (Addendum)
-  Take the Zofran (ondansetron) up to 3 times daily for nausea and vomiting. Dissolve one pill under your tongue or between your teeth and your cheek. -Drink plenty of fluids and eat a bland diet as tolerated. -I suspect that you have food poisoning based on your symptoms.  Given your history of a bowel obstruction, it is important that you pay attention to your symptoms and seek additional immediate medical attention if they get worse.  Signs of a bowel obstruction include you stop having bowel movements, you develop severe abdominal pain, you start vomiting uncontrollably or vomiting of fecal matter.  Head straight to the emergency department if you develop these symptoms, or new symptoms like dizziness, chest pain, shortness of breath.

## 2021-05-28 NOTE — ED Triage Notes (Signed)
Pt presents with generalized abdominal pain, vomiting, and diarrhea since yesterday.

## 2021-05-28 NOTE — Telephone Encounter (Signed)
Nurse attempted to return patient phone call regarding rx. No answer. Voice message left for patient

## 2021-05-30 ENCOUNTER — Emergency Department (HOSPITAL_COMMUNITY): Payer: Medicare Other

## 2021-05-30 ENCOUNTER — Other Ambulatory Visit: Payer: Self-pay

## 2021-05-30 ENCOUNTER — Emergency Department (HOSPITAL_COMMUNITY)
Admission: EM | Admit: 2021-05-30 | Discharge: 2021-05-30 | Disposition: A | Discharge status: Home / Self Care | Payer: Medicare Other | Attending: Emergency Medicine | Admitting: Emergency Medicine

## 2021-05-30 ENCOUNTER — Encounter (HOSPITAL_COMMUNITY): Payer: Self-pay | Admitting: Student

## 2021-05-30 DIAGNOSIS — I10 Essential (primary) hypertension: Secondary | ICD-10-CM | POA: Insufficient documentation

## 2021-05-30 DIAGNOSIS — Z794 Long term (current) use of insulin: Secondary | ICD-10-CM | POA: Diagnosis not present

## 2021-05-30 DIAGNOSIS — Z7982 Long term (current) use of aspirin: Secondary | ICD-10-CM | POA: Diagnosis not present

## 2021-05-30 DIAGNOSIS — Z79899 Other long term (current) drug therapy: Secondary | ICD-10-CM | POA: Diagnosis not present

## 2021-05-30 DIAGNOSIS — Z7984 Long term (current) use of oral hypoglycemic drugs: Secondary | ICD-10-CM | POA: Insufficient documentation

## 2021-05-30 DIAGNOSIS — E119 Type 2 diabetes mellitus without complications: Secondary | ICD-10-CM | POA: Insufficient documentation

## 2021-05-30 DIAGNOSIS — K2980 Duodenitis without bleeding: Secondary | ICD-10-CM | POA: Diagnosis not present

## 2021-05-30 DIAGNOSIS — R1013 Epigastric pain: Secondary | ICD-10-CM | POA: Diagnosis present

## 2021-05-30 LAB — TROPONIN I (HIGH SENSITIVITY)
Troponin I (High Sensitivity): 9 ng/L (ref <18)
Troponin I (High Sensitivity): 8 ng/L (ref <18)

## 2021-05-30 LAB — HEPATIC FUNCTION PANEL
ALT: 21 U/L (ref 0–44)
AST: 22 U/L (ref 15–41)
Albumin: 3.7 g/dL (ref 3.5–5.0)
Alkaline Phosphatase: 63 U/L (ref 38–126)
Bilirubin, Direct: 0.3 mg/dL — ABNORMAL HIGH (ref 0.0–0.2)
Indirect Bilirubin: 1.3 mg/dL — ABNORMAL HIGH (ref 0.3–0.9)
Total Bilirubin: 1.6 mg/dL — ABNORMAL HIGH (ref 0.3–1.2)
Total Protein: 6.6 g/dL (ref 6.5–8.1)

## 2021-05-30 LAB — LIPASE, BLOOD: Lipase: 32 U/L (ref 11–51)

## 2021-05-30 LAB — CBC
HCT: 49.8 % (ref 39.0–52.0)
Hemoglobin: 16.9 g/dL (ref 13.0–17.0)
MCH: 30 pg (ref 26.0–34.0)
MCHC: 33.9 g/dL (ref 30.0–36.0)
MCV: 88.3 fL (ref 80.0–100.0)
Platelets: 236 10*3/uL (ref 150–400)
RBC: 5.64 MIL/uL (ref 4.22–5.81)
RDW: 12.4 % (ref 11.5–15.5)
WBC: 12.2 10*3/uL — ABNORMAL HIGH (ref 4.0–10.5)
nRBC: 0 % (ref 0.0–0.2)

## 2021-05-30 LAB — BASIC METABOLIC PANEL
Anion gap: 11 (ref 5–15)
BUN: 20 mg/dL (ref 8–23)
CO2: 26 mmol/L (ref 22–32)
Calcium: 9.6 mg/dL (ref 8.9–10.3)
Chloride: 102 mmol/L (ref 98–111)
Creatinine, Ser: 0.86 mg/dL (ref 0.61–1.24)
GFR, Estimated: >60 mL/min (ref >60)
Glucose, Bld: 218 mg/dL — ABNORMAL HIGH (ref 70–99)
Potassium: 4 mmol/L (ref 3.5–5.1)
Sodium: 139 mmol/L (ref 135–145)

## 2021-05-30 MED ORDER — KETOROLAC TROMETHAMINE 30 MG/ML IJ SOLN
15.0000 mg | Freq: Once | INTRAMUSCULAR | Status: AC
  Administered 2021-05-30: 15 mg via INTRAVENOUS
  Filled 2021-05-30: qty 1

## 2021-05-30 MED ORDER — ONDANSETRON HCL 4 MG/2ML IJ SOLN
4.0000 mg | Freq: Once | INTRAMUSCULAR | Status: AC
  Administered 2021-05-30: 4 mg via INTRAVENOUS
  Filled 2021-05-30: qty 2

## 2021-05-30 MED ORDER — DICYCLOMINE HCL 10 MG PO CAPS: 10.0000 mg | ORAL_CAPSULE | Freq: Four times a day (QID) | ORAL | 0 refills | Status: DC

## 2021-05-30 NOTE — Discharge Instructions (Addendum)
Mr. Andringa it was a pleasure taking care of you. The scan of you abdomen showed some inflammation in the bowel. You EKG was normal and labs were also unconcern. You can take tylenol as needed for pain. I have also prescribed bentyl to hep with stomach cramps and diarrhea that you can also as needed. Continue with the pantoprazole and zofran that you were previously prescribed. Try to drink as much water and liquids as you can tolerate to stay hydrated. Eat soft bland food and increase your diet as tolerated. It can las a couple weeks to gradually feel better. Please return to the ED if you pain become worse, you stop having all bowl movements or are no passing gas, or develop fever.

## 2021-05-30 NOTE — ED Triage Notes (Signed)
Pt reports upper abdominal pain starting Sunday morning with associated n/v/d. Pain has migrated now into R chest and feels only nauseas now with dry heaves. Endorses shob. Symptoms worse with movement and deep breathing/cough.

## 2021-05-30 NOTE — ED Provider Notes (Signed)
[facility] EMERGENCY DEPARTMENT Provider Note   CSN: [id number] Arrival date & time: 05/30/21  0748     History Chief Complaint  Patient presents with  . Chest Pain    Dean Zimmerman is a 70 y.o. male with a history of remote history of SBO, diabetes mellitus, hyperlipidemia, hypertension, and sleep apnea who presents to [facility] for right sided chest pain that started this morning. He first started having epigastric pain with n/v/d 4 days ago after eating out at a restaurant. Was seen at [facility] 2 days ago for this. Reports he had numerous episodes of liquid stools and brown emesis with small amount of blood. Was treated with zofran likely due to food poisoning.  Had a telehealth appointment yesterday with primary care for persistent abdominal pain and prescribed more Zofran in addition to pantoprazole. Took first dose of pantoprazole this morning without relief. Diarrhea and vomiting have stopped 2 days ago, but has persistent epigastric pain no radiating to the right upper chest area. Describes pain as constant sharp and cramping.This is worse with activity and better with laying flat. Able to tolerate small amounts of water, jello, and whipped cream for the past 2 days, but feels very full and has dry heaves.  Only able to eat a couple of bites due to bloating. Has some associated shortness of breath with this and endorses loss of apetite. Also endorses burning sensation when drinking liquids. Has not had any bowel movements since Monday doe report passing gas yesterday night. Denies fever, chills, palpitations, diaphoresis, dizziness, syncope, or weakness. Endorses distant history of SBO 15+ years ago requiring NG tube but did not require surgery. Has been taking all his medication except celecoxib and farxiga which he stopped 2 days ago.     Past Medical History:  Diagnosis Date  . Diabetes mellitus without complication ([place])   . Hyperlipidemia   . Hypertension    . Sleep apnea    Family history: Mother diet from aortic aneurysm  Surgical history: History of hernia repair with revision. Social History   Substance Use Topics  . Drug use: Never    Home Medications Prior to Admission medications   Medication Sig Start Date End Date Taking? Authorizing Provider  aspirin 81 MG tablet Take 81 mg by mouth daily.   Yes [provider]  atorvastatin (LIPITOR) 20 MG tablet Take 20 mg by mouth daily.   Yes [provider]  Cholecalciferol (VITAMIN D) 2000 units CAPS Take 4,000 Units by mouth.   Yes [provider]  enalapril (VASOTEC) 10 MG tablet Take 10 mg by mouth daily.   Yes [provider]  escitalopram (LEXAPRO) 10 MG tablet Take 10 mg by mouth daily.   Yes [provider]  FARXIGA 5 MG TABS tablet Take 5 mg by mouth daily. 05/24/21  Yes [provider]  insulin glargine (LANTUS) 100 UNIT/ML injection Inject 50 Units into the skin daily.   Yes [provider]  Latanoprost 0.005 % EMUL Place 1 drop into both eyes at bedtime.   Yes [provider]  levothyroxine (SYNTHROID, LEVOTHROID) 150 MCG tablet Take 150 mcg by mouth daily before breakfast.   Yes [provider]  metFORMIN (GLUCOPHAGE-XR) 500 MG 24 hr tablet Take 1,000 mg by mouth daily with supper.   Yes [provider]  ondansetron (ZOFRAN ODT) 8 MG disintegrating tablet Take 1 tablet (8 mg total) by mouth every 8 (eight) hours as needed for nausea or  vomiting. 05/28/21  Yes Hazel Sams, PA-C  OZEMPIC, 1 MG/DOSE, 4 MG/3ML SOPN Inject 1 mg into the skin once a week. Sunday 03/18/21  Yes [provider]  pantoprazole (PROTONIX) 40 MG tablet Take 40 mg by mouth daily.   Yes [provider]  tadalafil (CIALIS) 20 MG tablet Take 20 mg by mouth daily as needed for erectile dysfunction. 03/18/21  Yes [provider]  celecoxib (CELEBREX) 200 MG capsule Take 200 mg by mouth daily.     [provider]  ondansetron (ZOFRAN ODT) 8 MG disintegrating tablet Take 1 tablet (8 mg total) by mouth every 8 (eight) hours as needed for nausea or vomiting. Patient not taking: No sig reported 05/28/21   Hazel Sams, PA-C    Allergies    Hydrocodone and Oxycontin [oxycodone hcl]  Review of Systems   Review of Systems  Constitutional: Positive for appetite change. Negative for fever.  HENT: Negative for rhinorrhea and sore throat.   Eyes: Negative for photophobia and pain.  Respiratory: Negative for cough, chest tightness and wheezing.   Cardiovascular: Positive for chest pain (rught middle).  Gastrointestinal: Positive for abdominal pain and nausea. Negative for abdominal distention, blood in stool, constipation, diarrhea and vomiting.  Endocrine: Negative for polydipsia, polyphagia and polyuria.  Genitourinary: Negative for dysuria and flank pain.  Musculoskeletal: Negative for arthralgias and back pain.  Skin: Negative for pallor and rash.  Neurological: Negative for dizziness, syncope, weakness and numbness.  Psychiatric/Behavioral: Negative for agitation and confusion.    Physical Exam Updated Vital Signs BP 140/70   Pulse 68   Temp 97.7 F (36.5 C) (Oral)   Resp 10   Ht 5\' 10"  (1.778 m)   Wt 127 kg   SpO2 98%   BMI 40.18 kg/m   Physical Exam Constitutional:      General: He is not in acute distress.    Appearance: He is obese. He is not diaphoretic.  HENT:     Head: Normocephalic and atraumatic.  Eyes:     Extraocular Movements: Extraocular movements intact.     Pupils: Pupils are equal, round, and reactive to light.  Cardiovascular:     Rate and Rhythm: Normal rate and regular rhythm.     Pulses:          Radial pulses are 2+ on the right side and 2+ on the left side.     Heart sounds: Normal heart sounds. No murmur heard. No friction rub. No gallop.   Pulmonary:     Effort: Pulmonary effort is normal. No respiratory distress.     Breath  sounds: Normal breath sounds. No stridor. No wheezing or rales.  Chest:     Chest wall: No deformity or tenderness.  Abdominal:     General: There is no abdominal bruit.     Palpations: Abdomen is soft. There is no hepatomegaly or mass.     Tenderness: There is no abdominal tenderness. There is no guarding or rebound.     Comments: Bowel sounds quiet but present in all 4 quadrants, mild distension, negative murphy sign  Musculoskeletal:     Right lower leg: No edema.     Left lower leg: No edema.  Skin:    General: Skin is warm and dry.  Neurological:     General: No focal deficit present.     Mental Status: He is alert and oriented to person, place, and time.  Psychiatric:        Mood  and Affect: Mood normal.        Behavior: Behavior normal.     ED Results / Procedures / Treatments   Labs (all labs ordered are listed, but only abnormal results are displayed) Labs Reviewed  BASIC METABOLIC PANEL - Abnormal; Notable for the following components:      Result Value   Glucose, Bld 218 (*)    All other components within normal limits  CBC - Abnormal; Notable for the following components:   WBC 12.2 (*)    All other components within normal limits  HEPATIC FUNCTION PANEL - Abnormal; Notable for the following components:   Total Bilirubin 1.6 (*)    Bilirubin, Direct 0.3 (*)    Indirect Bilirubin 1.3 (*)    All other components within normal limits  LIPASE, BLOOD  TROPONIN I (HIGH SENSITIVITY)  TROPONIN I (HIGH SENSITIVITY)    EKG EKG Interpretation  Date/Time:  Wednesday May 30 2021 07:54:51 EDT Ventricular Rate:  77 PR Interval:  140 QRS Duration: 84 QT Interval:  392 QTC Calculation: 443 R Axis:   -24 Text Interpretation: Normal sinus rhythm Minimal voltage criteria for LVH, may be normal variant ( R in aVL ) Borderline ECG Confirmed by Carmin Muskrat [id number]) on 05/30/2021 7:58:14 AM   Radiology CT Abdomen Pelvis Wo Contrast  Result Date: 05/30/2021 CLINICAL  DATA:  70 year old male with history of upper abdominal pain with nausea, vomiting and diarrhea since Sunday. EXAM: CT ABDOMEN AND PELVIS WITHOUT CONTRAST TECHNIQUE: Multidetector CT imaging of the abdomen and pelvis was performed following the standard protocol without IV contrast. COMPARISON:  No priors. FINDINGS: Lower chest: Unremarkable. Hepatobiliary: No definite suspicious cystic or solid hepatic lesions are confidently identified on today's noncontrast CT examination. Unenhanced appearance of the gallbladder is normal. Pancreas: No pancreatic mass. No pancreatic ductal dilatation. No peripancreatic fluid collections or inflammatory changes. Spleen: Unremarkable. Adrenals/Urinary Tract: Unenhanced appearance of the kidneys, bilateral adrenal glands and urinary bladder is normal. No hydroureteronephrosis. Stomach/Bowel: Stomach is moderately distended. Mural thickening is noted in the distal aspect of the second portion of the duodenum, best appreciated on axial image 44 of series 3 where there are some subtle surrounding inflammatory changes lateral to the duodenum. No pathologic dilatation of small bowel or colon. Normal appendix. Vascular/Lymphatic: Aortic atherosclerosis. No lymphadenopathy noted in the abdomen or pelvis. Reproductive: Prostate gland and seminal vesicles are unremarkable in appearance. Other: No significant volume of ascites.  No pneumoperitoneum. Musculoskeletal: There are no aggressive appearing lytic or blastic lesions noted in the visualized portions of the skeleton. IMPRESSION: 1. Inflammatory changes adjacent to the distal aspect of the second portion of the duodenum which appears thickened. Findings are concerning for duodenitis. 2. No evidence of bowel obstruction. 3. Aortic atherosclerosis. 4. Additional incidental findings, as above. Electronically Signed   By: Vinnie Langton M.D.   On: 05/30/2021 09:36   DG Chest 2 View  Result Date: 05/30/2021 CLINICAL DATA:  Chest pain.  EXAM: CHEST - 2 VIEW COMPARISON:  CT 10/25/2020.  Chest x-ray 09/22/2017. FINDINGS: Mediastinum hilar structures normal. Heart size normal. Low lung volumes. No focal infiltrate. Mild pleuroparenchymal thickening consistent scarring. No pleural effusion or pneumothorax. Post surgical changes right shoulder. Degenerative changes scoliosis thoracic spine. IMPRESSION: Low lung volumes.  No acute cardiopulmonary disease identified. Electronically Signed   By: Marcello Moores  Register   On: 05/30/2021 08:07    Procedures Procedures   Medications Ordered in ED Medications  ketorolac (TORADOL) 30 MG/ML injection 15 mg (15 mg Intravenous  Given 05/30/21 0906)  ondansetron (ZOFRAN) injection 4 mg (4 mg Intravenous Given 05/30/21 1610)    ED Course  I have reviewed the triage vital signs and the nursing notes.  Pertinent labs & imaging results that were available during my care of the patient were reviewed by me and considered in my medical decision making (see chart for details).  Blood pressure (!) 141/68, pulse 67, temperature 97.7 F (36.5 C), temperature source Oral, resp. rate 13, height 5\' 10"  (1.778 m), weight 127 kg, SpO2 99 %.  EKG without acute ischemic changes. Troponin negative.  Mild leukocytosis of 12.2 on BMP.  Glucose of 218. No electrolyte abnormalities. Lipase 32. CT abdomen with duodenitis of the second portion of the duodenum, No bowel obstruction.   Treated with ketorolac and Zofran for pain and nausea with improvement of symptoms. Return precautions given.   MDM Rules/Calculators/A&P                        Patient presenting with epigastric pain no radiating to the right middle chest with associated burning sensation with drinking after several days of N/v/d.   Afebrile, HD stable. EKG with NSR, HR 71, no acute ischemic changes. Troponin of 9. Less likely pain is from cardiac etiology. Pain appears more GI in nature given history of N/V/D after eating out 4 days ago. Now with persistent  pain with bloating and decreased bowel movements. Likely sequela from food borne illness  but will check CMP, lipase, and CT abdomen to rule out SBO. Mild leukocytosis and mild elevation in bilirubin. Normal lipase, AST, ALT. CT consistent with duodenitis with signs of obstruction. Feeling better after zofran and pain medicine. Discussed with patient and spouse likely duodenitis is secondary to food borne illness. Discharge home with bentyl and instructed to continue pantoprazole and zofran from telehealth appointment yesterday. Return precautions given for fever, worsening pain, inability to tolerate fluids or food.   Final Clinical Impression(s) / ED Diagnoses Final diagnoses:  Duodenitis    Rx / DC Orders ED Discharge Orders    None       Iona Beard, MD 05/30/21 1151    Carmin Muskrat, MD 05/30/21 [phone]

## 2022-12-16 ENCOUNTER — Other Ambulatory Visit: Payer: Self-pay | Admitting: Family Medicine

## 2022-12-16 DIAGNOSIS — R7401 Elevation of levels of liver transaminase levels: Secondary | ICD-10-CM

## 2023-01-02 ENCOUNTER — Ambulatory Visit
Admission: RE | Admit: 2023-01-02 | Discharge: 2023-01-02 | Disposition: A | Discharge status: Home / Self Care | Payer: Medicare Other | Source: Ambulatory Visit | Attending: Family Medicine | Admitting: Family Medicine

## 2023-01-02 DIAGNOSIS — R7401 Elevation of levels of liver transaminase levels: Secondary | ICD-10-CM

## 2023-01-27 ENCOUNTER — Other Ambulatory Visit: Payer: Self-pay

## 2023-01-27 ENCOUNTER — Encounter (HOSPITAL_BASED_OUTPATIENT_CLINIC_OR_DEPARTMENT_OTHER): Payer: Self-pay | Admitting: Emergency Medicine

## 2023-01-27 ENCOUNTER — Emergency Department (HOSPITAL_BASED_OUTPATIENT_CLINIC_OR_DEPARTMENT_OTHER)
Admission: EM | Admit: 2023-01-27 | Discharge: 2023-01-28 | Disposition: A | Discharge status: Home / Self Care | Payer: Medicare Other | Attending: Emergency Medicine | Admitting: Emergency Medicine

## 2023-01-27 DIAGNOSIS — E1165 Type 2 diabetes mellitus with hyperglycemia: Secondary | ICD-10-CM | POA: Insufficient documentation

## 2023-01-27 DIAGNOSIS — Z79899 Other long term (current) drug therapy: Secondary | ICD-10-CM | POA: Diagnosis not present

## 2023-01-27 DIAGNOSIS — E119 Type 2 diabetes mellitus without complications: Secondary | ICD-10-CM | POA: Diagnosis not present

## 2023-01-27 DIAGNOSIS — Z96659 Presence of unspecified artificial knee joint: Secondary | ICD-10-CM | POA: Insufficient documentation

## 2023-01-27 DIAGNOSIS — Z96619 Presence of unspecified artificial shoulder joint: Secondary | ICD-10-CM | POA: Diagnosis not present

## 2023-01-27 DIAGNOSIS — Z7982 Long term (current) use of aspirin: Secondary | ICD-10-CM | POA: Insufficient documentation

## 2023-01-27 DIAGNOSIS — R112 Nausea with vomiting, unspecified: Secondary | ICD-10-CM | POA: Diagnosis not present

## 2023-01-27 DIAGNOSIS — R109 Unspecified abdominal pain: Secondary | ICD-10-CM | POA: Diagnosis not present

## 2023-01-27 DIAGNOSIS — Z794 Long term (current) use of insulin: Secondary | ICD-10-CM | POA: Diagnosis not present

## 2023-01-27 DIAGNOSIS — I10 Essential (primary) hypertension: Secondary | ICD-10-CM | POA: Diagnosis not present

## 2023-01-27 DIAGNOSIS — K5900 Constipation, unspecified: Secondary | ICD-10-CM | POA: Insufficient documentation

## 2023-01-27 DIAGNOSIS — R197 Diarrhea, unspecified: Secondary | ICD-10-CM | POA: Insufficient documentation

## 2023-01-27 DIAGNOSIS — E860 Dehydration: Secondary | ICD-10-CM | POA: Insufficient documentation

## 2023-01-27 DIAGNOSIS — R739 Hyperglycemia, unspecified: Secondary | ICD-10-CM

## 2023-01-27 HISTORY — DX: Type 2 diabetes mellitus without complications: E11.9

## 2023-01-27 LAB — CBC WITH DIFFERENTIAL/PLATELET
Abs Immature Granulocytes: 0.13 10*3/uL — ABNORMAL HIGH (ref 0.00–0.07)
Basophils Absolute: 0.1 10*3/uL (ref 0.0–0.1)
Basophils Relative: 1 %
Eosinophils Absolute: 0 10*3/uL (ref 0.0–0.5)
Eosinophils Relative: 0 %
HCT: 29.9 % — ABNORMAL LOW (ref 39.0–52.0)
Hemoglobin: 10.7 g/dL — ABNORMAL LOW (ref 13.0–17.0)
Immature Granulocytes: 1 %
Lymphocytes Relative: 8 %
Lymphs Abs: 1.4 10*3/uL (ref 0.7–4.0)
MCH: 32.5 pg (ref 26.0–34.0)
MCHC: 35.8 g/dL (ref 30.0–36.0)
MCV: 90.9 fL (ref 80.0–100.0)
Monocytes Absolute: 1.2 10*3/uL — ABNORMAL HIGH (ref 0.1–1.0)
Monocytes Relative: 7 %
Neutro Abs: 14.8 10*3/uL — ABNORMAL HIGH (ref 1.7–7.7)
Neutrophils Relative %: 83 %
Platelets: 367 10*3/uL (ref 150–400)
RBC: 3.29 MIL/uL — ABNORMAL LOW (ref 4.22–5.81)
RDW: 12.6 % (ref 11.5–15.5)
WBC: 17.7 10*3/uL — ABNORMAL HIGH (ref 4.0–10.5)
nRBC: 0 % (ref 0.0–0.2)

## 2023-01-27 LAB — COMPREHENSIVE METABOLIC PANEL
ALT: 43 U/L (ref 0–44)
AST: 29 U/L (ref 15–41)
Albumin: 4 g/dL (ref 3.5–5.0)
Alkaline Phosphatase: 65 U/L (ref 38–126)
Anion gap: 15 (ref 5–15)
BUN: 32 mg/dL — ABNORMAL HIGH (ref 8–23)
CO2: 24 mmol/L (ref 22–32)
Calcium: 9.5 mg/dL (ref 8.9–10.3)
Chloride: 101 mmol/L (ref 98–111)
Creatinine, Ser: 0.8 mg/dL (ref 0.61–1.24)
GFR, Estimated: >60 mL/min (ref >60)
Glucose, Bld: 308 mg/dL — ABNORMAL HIGH (ref 70–99)
Potassium: 3.7 mmol/L (ref 3.5–5.1)
Sodium: 140 mmol/L (ref 135–145)
Total Bilirubin: 0.7 mg/dL (ref 0.3–1.2)
Total Protein: 6.7 g/dL (ref 6.5–8.1)

## 2023-01-27 LAB — URINALYSIS, ROUTINE W REFLEX MICROSCOPIC
Bacteria, UA: NONE SEEN
Bilirubin Urine: NEGATIVE
Glucose, UA: >1000 mg/dL — AB
Hgb urine dipstick: NEGATIVE
Ketones, ur: 40 mg/dL — AB
Leukocytes,Ua: NEGATIVE
Nitrite: NEGATIVE
Protein, ur: NEGATIVE mg/dL
Specific Gravity, Urine: 1.034 — ABNORMAL HIGH (ref 1.005–1.030)
pH: 6.5 (ref 5.0–8.0)

## 2023-01-27 LAB — LIPASE, BLOOD: Lipase: 66 U/L — ABNORMAL HIGH (ref 11–51)

## 2023-01-27 LAB — CBG MONITORING, ED: Glucose-Capillary: 279 mg/dL — ABNORMAL HIGH (ref 70–99)

## 2023-01-27 MED ORDER — ONDANSETRON 4 MG PO TBDP: 4.0000 mg | ORAL_TABLET | Freq: Once | ORAL | Status: DC | PRN

## 2023-01-27 MED ORDER — ONDANSETRON HCL 4 MG/2ML IJ SOLN
4.0000 mg | Freq: Once | INTRAMUSCULAR | Status: AC
  Administered 2023-01-28: 4 mg via INTRAVENOUS
  Filled 2023-01-27: qty 2

## 2023-01-27 MED ORDER — METOCLOPRAMIDE HCL 5 MG/ML IJ SOLN
10.0000 mg | Freq: Once | INTRAMUSCULAR | Status: AC | PRN
  Administered 2023-01-28: 10 mg via INTRAVENOUS
  Filled 2023-01-27: qty 2

## 2023-01-27 MED ORDER — INSULIN ASPART 100 UNIT/ML IJ SOLN: 5.0000 [IU] | Freq: Once | INTRAMUSCULAR | Status: DC

## 2023-01-27 MED ORDER — ONDANSETRON HCL 4 MG/2ML IJ SOLN
4.0000 mg | Freq: Once | INTRAMUSCULAR | Status: AC
  Administered 2023-01-27: 4 mg via INTRAVENOUS
  Filled 2023-01-27: qty 2

## 2023-01-27 MED ORDER — LACTATED RINGERS IV BOLUS
1000.0000 mL | Freq: Once | INTRAVENOUS | Status: AC
  Administered 2023-01-28: 1000 mL via INTRAVENOUS

## 2023-01-27 NOTE — ED Triage Notes (Addendum)
Diarrhea, upset stomach, nausea/ vomiting started 1 week ago  Today noticed worsening symptoms and high blood sugars  >300  Took 30 units lantus around 9:30 PM

## 2023-01-27 NOTE — ED Provider Notes (Signed)
[facility] Provider Note: Georgena Spurling, MD, FACEP  CSN: [id number] MRN: [id number] ARRIVAL: 01/27/23 at 45 ROOM: DB009/DB009   CHIEF COMPLAINT  N/V/D   HISTORY OF PRESENT ILLNESS  01/27/23 11:43 PM Dean Zimmerman is a 72 y.o. male notes some abdominal pain and was constipated 8 days ago.  He was seen at an urgent care and advised to start taking MiraLAX.  He has been taking MiraLAX nearly every day since.  Since starting the MiraLAX he has been having diarrhea with only occasional formed stools.  3 days ago he started vomiting and has had intermittent vomiting since then.  Today the vomiting worsened and he has not been able to keep anything on his stomach including medications.  His blood sugar has become uncontrolled and it was over 300 at home, 276 here.  He took 30 units of Lantus around 9:30 PM.  He states he was told to keep taking the MiraLAX until he started passing normal, soft, formed stools.  He has had no further abdominal pain since the first day.   Past Medical History:  Diagnosis Date   DM (diabetes mellitus) ([place])    Hyperlipidemia    Hypertension    Sleep apnea     Past Surgical History:  Procedure Laterality Date   HERNIA REPAIR     REPLACEMENT TOTAL KNEE     TOTAL SHOULDER REPLACEMENT      Family History  Problem Relation Age of Onset   Aortic aneurysm Mother     Social History   Tobacco Use   Smoking status: Former    Types: Cigarettes   Smokeless tobacco: Never  Substance Use Topics   Drug use: Never    Prior to Admission medications   Medication Sig Start Date End Date Taking? Authorizing Provider  metoCLOPramide (REGLAN) 10 MG tablet Take 1 tablet (10 mg total) by mouth every 6 (six) hours as needed for nausea or vomiting. 01/28/23  Yes Dhanya Bogle, MD  aspirin 81 MG tablet Take 81 mg by mouth daily.    [provider]  atorvastatin (LIPITOR) 20 MG tablet Take 20 mg by mouth daily.    [provider]  celecoxib  (CELEBREX) 200 MG capsule Take 200 mg by mouth daily.    [provider]  Cholecalciferol (VITAMIN D) 2000 units CAPS Take 4,000 Units by mouth.    [provider]  dicyclomine (BENTYL) 10 MG capsule Take 1 capsule (10 mg total) by mouth 4 (four) times daily for 7 days. 05/30/21 06/06/21  Iona Beard, MD  enalapril (VASOTEC) 10 MG tablet Take 10 mg by mouth daily.    [provider]  escitalopram (LEXAPRO) 10 MG tablet Take 10 mg by mouth daily.    [provider]  FARXIGA 5 MG TABS tablet Take 5 mg by mouth daily. 05/24/21   [provider]  insulin glargine (LANTUS) 100 UNIT/ML injection Inject 50 Units into the skin daily.    [provider]  Latanoprost 0.005 % EMUL Place 1 drop into both eyes at bedtime.    [provider]  levothyroxine (SYNTHROID, LEVOTHROID) 150 MCG tablet Take 150 mcg by mouth daily before breakfast.    [provider]  metFORMIN (GLUCOPHAGE-XR) 500 MG 24 hr tablet Take 1,000 mg by mouth daily with supper.    [provider]  OZEMPIC, 1 MG/DOSE, 4 MG/3ML SOPN Inject 1 mg into the skin once a week. Sunday 03/18/21   [provider]  pantoprazole ([place])  40 MG tablet Take 40 mg by mouth daily.    [provider]  tadalafil (CIALIS) 20 MG tablet Take 20 mg by mouth daily as needed for erectile dysfunction. 03/18/21   [provider]    Allergies Hydrocodone and Oxycontin [oxycodone hcl]   REVIEW OF SYSTEMS  Negative except as noted here or in the History of Present Illness.   PHYSICAL EXAMINATION  Initial Vital Signs Blood pressure (!) 178/129, pulse (!) 110, temperature 98 F (36.7 C), resp. rate 18, SpO2 100 %.  Examination General: Well-developed, well-nourished male in no acute distress; appearance consistent with age of record HENT: normocephalic; atraumatic Eyes: Normal appearance Neck: supple Heart: regular rate and rhythm; tachycardia Lungs: clear  to auscultation bilaterally Abdomen: soft; nondistended; nontender; bowel sounds present Extremities: No deformity; full range of motion; pulses normal Neurologic: Awake, alert and oriented; motor function intact in all extremities and symmetric; no facial droop Skin: Warm and dry Psychiatric: Normal mood and affect   RESULTS  Summary of this visit's results, reviewed and interpreted by myself:   EKG Interpretation  Date/Time:    Ventricular Rate:    PR Interval:    QRS Duration:   QT Interval:    QTC Calculation:   R Axis:     Text Interpretation:         Laboratory Studies: Results for orders placed or performed during the hospital encounter of 01/27/23 (from the past 24 hour(s))  CBG monitoring, ED     Status: Abnormal   Collection Time: 01/27/23 10:56 PM  Result Value Ref Range   Glucose-Capillary 279 (H) 70 - 99 mg/dL  Lipase, blood     Status: Abnormal   Collection Time: 01/27/23 11:18 PM  Result Value Ref Range   Lipase 66 (H) 11 - 51 U/L  Comprehensive metabolic panel     Status: Abnormal   Collection Time: 01/27/23 11:18 PM  Result Value Ref Range   Sodium 140 135 - 145 mmol/L   Potassium 3.7 3.5 - 5.1 mmol/L   Chloride 101 98 - 111 mmol/L   CO2 24 22 - 32 mmol/L   Glucose, Bld 308 (H) 70 - 99 mg/dL   BUN 32 (H) 8 - 23 mg/dL   Creatinine, Ser 0.80 0.61 - 1.24 mg/dL   Calcium 9.5 8.9 - 10.3 mg/dL   Total Protein 6.7 6.5 - 8.1 g/dL   Albumin 4.0 3.5 - 5.0 g/dL   AST 29 15 - 41 U/L   ALT 43 0 - 44 U/L   Alkaline Phosphatase 65 38 - 126 U/L   Total Bilirubin 0.7 0.3 - 1.2 mg/dL   GFR, Estimated >60 >60 mL/min   Anion gap 15 5 - 15  Urinalysis, Routine w reflex microscopic -Urine, Clean Catch     Status: Abnormal   Collection Time: 01/27/23 11:18 PM  Result Value Ref Range   Color, Urine YELLOW YELLOW   APPearance CLEAR CLEAR   Specific Gravity, Urine 1.034 (H) 1.005 - 1.030   pH 6.5 5.0 - 8.0   Glucose, UA >1,000 (A) NEGATIVE mg/dL   Hgb urine  dipstick NEGATIVE NEGATIVE   Bilirubin Urine NEGATIVE NEGATIVE   Ketones, ur 40 (A) NEGATIVE mg/dL   Protein, ur NEGATIVE NEGATIVE mg/dL   Nitrite NEGATIVE NEGATIVE   Leukocytes,Ua NEGATIVE NEGATIVE   RBC / HPF 0-5 0 - 5 RBC/hpf   WBC, UA 0-5 0 - 5 WBC/hpf   Bacteria, UA NONE SEEN NONE SEEN   Squamous Epithelial /  HPF 0-5 0 - 5 /HPF  CBC with Differential     Status: Abnormal   Collection Time: 01/27/23 11:18 PM  Result Value Ref Range   WBC 17.7 (H) 4.0 - 10.5 K/uL   RBC 3.29 (L) 4.22 - 5.81 MIL/uL   Hemoglobin 10.7 (L) 13.0 - 17.0 g/dL   HCT 29.9 (L) 39.0 - 52.0 %   MCV 90.9 80.0 - 100.0 fL   MCH 32.5 26.0 - 34.0 pg   MCHC 35.8 30.0 - 36.0 g/dL   RDW 12.6 11.5 - 15.5 %   Platelets 367 150 - 400 K/uL   nRBC 0.0 0.0 - 0.2 %   Neutrophils Relative % 83 %   Neutro Abs 14.8 (H) 1.7 - 7.7 K/uL   Lymphocytes Relative 8 %   Lymphs Abs 1.4 0.7 - 4.0 K/uL   Monocytes Relative 7 %   Monocytes Absolute 1.2 (H) 0.1 - 1.0 K/uL   Eosinophils Relative 0 %   Eosinophils Absolute 0.0 0.0 - 0.5 K/uL   Basophils Relative 1 %   Basophils Absolute 0.1 0.0 - 0.1 K/uL   Immature Granulocytes 1 %   Abs Immature Granulocytes 0.13 (H) 0.00 - 0.07 K/uL  CBG monitoring, ED     Status: Abnormal   Collection Time: 01/28/23 12:04 AM  Result Value Ref Range   Glucose-Capillary 311 (H) 70 - 99 mg/dL  CBG monitoring, ED     Status: Abnormal   Collection Time: 01/28/23  1:28 AM  Result Value Ref Range   Glucose-Capillary 242 (H) 70 - 99 mg/dL   Imaging Studies: No results found.  ED COURSE and MDM  Nursing notes, initial and subsequent vitals signs, including pulse oximetry, reviewed and interpreted by myself.  Vitals:   01/27/23 2254 01/28/23 0020 01/28/23 0100  BP: (!) 178/129 130/65 116/61  Pulse: (!) 110 96 90  Resp: 18 16   Temp: 98 F (36.7 C)    SpO2: 100% 96% 93%   Medications  ondansetron (ZOFRAN) injection 4 mg (4 mg Intravenous Given 01/27/23 2313)  lactated ringers bolus 1,000  mL (0 mLs Intravenous Stopped 01/28/23 0132)  ondansetron (ZOFRAN) injection 4 mg (4 mg Intravenous Given 01/28/23 0018)  metoCLOPramide (REGLAN) injection 10 mg (10 mg Intravenous Given 01/28/23 0018)  insulin aspart (novoLOG) injection 10 Units (10 Units Subcutaneous Given 01/28/23 0018)   1:46 AM Patient did not get adequate relief of his nausea with IV Zofran but did get relief with Reglan and his nausea is now under control.  He was given IV fluids and subcu insulin and his sugar is down to 242.  He was advised to discontinue the MiraLAX as this may be causing his persistent diarrhea.  He was advised to start taking his regular medications and to try to eat regular meals to get his sugar under control.  Although he has a leukocytosis he does not have any abdominal pain currently, nor for the past week, to suggest an acute intra-abdominal process for which imaging would be indicated at this time.  A viral gastroenteritis could cause a leukocytosis.   PROCEDURES  Procedures   ED DIAGNOSES     ICD-10-CM   1. Nausea, vomiting and diarrhea  R11.2    R19.7     2. Hyperglycemia  R73.9     3. Dehydration  E86.0          Jaythan Hinely, Jenny Reichmann, MD 01/28/23 [phone]

## 2023-01-28 LAB — CBG MONITORING, ED
Glucose-Capillary: 242 mg/dL — ABNORMAL HIGH (ref 70–99)
Glucose-Capillary: 311 mg/dL — ABNORMAL HIGH (ref 70–99)

## 2023-01-28 MED ORDER — METOCLOPRAMIDE HCL 10 MG PO TABS: 10.0000 mg | ORAL_TABLET | Freq: Four times a day (QID) | ORAL | 0 refills | Status: DC | PRN

## 2023-01-28 MED ORDER — ONDANSETRON 8 MG PO TBDP: 8.0000 mg | ORAL_TABLET | Freq: Three times a day (TID) | ORAL | 0 refills | Status: DC | PRN

## 2023-01-28 MED ORDER — INSULIN ASPART 100 UNIT/ML IJ SOLN
10.0000 [IU] | Freq: Once | INTRAMUSCULAR | Status: AC
  Administered 2023-01-28: 10 [IU] via SUBCUTANEOUS

## 2023-01-28 NOTE — ED Notes (Signed)
Late entry -- Pt awake and alert lying in bed; GCS 15.  Wife at bedside

## 2023-01-28 NOTE — ED Notes (Signed)
Pt agreeable with d/c plan as discussed by provider- this nurse has verbally reinforced d/c instructions and provided pt with written copy.  Pt acknowledges verbal understanding - denies any addl questions, concerns, needs- ambulatory independently at d/c with steady gait; vitals stable; no acute changes/distress noted.

## 2023-01-31 ENCOUNTER — Inpatient Hospital Stay (HOSPITAL_COMMUNITY)
Admission: EM | Admit: 2023-01-31 | Discharge: 2023-02-02 | DRG: 379 | Disposition: A | Discharge status: Home / Self Care | Payer: Medicare Other | Attending: Internal Medicine | Admitting: Internal Medicine

## 2023-01-31 ENCOUNTER — Observation Stay (HOSPITAL_COMMUNITY): Payer: Medicare Other

## 2023-01-31 ENCOUNTER — Other Ambulatory Visit: Payer: Self-pay

## 2023-01-31 ENCOUNTER — Encounter (HOSPITAL_COMMUNITY): Payer: Self-pay | Admitting: Internal Medicine

## 2023-01-31 DIAGNOSIS — M1990 Unspecified osteoarthritis, unspecified site: Secondary | ICD-10-CM

## 2023-01-31 DIAGNOSIS — I10 Essential (primary) hypertension: Secondary | ICD-10-CM | POA: Diagnosis present

## 2023-01-31 DIAGNOSIS — D649 Anemia, unspecified: Principal | ICD-10-CM | POA: Diagnosis present

## 2023-01-31 DIAGNOSIS — Z7982 Long term (current) use of aspirin: Secondary | ICD-10-CM

## 2023-01-31 DIAGNOSIS — Z794 Long term (current) use of insulin: Secondary | ICD-10-CM

## 2023-01-31 DIAGNOSIS — K922 Gastrointestinal hemorrhage, unspecified: Secondary | ICD-10-CM | POA: Diagnosis not present

## 2023-01-31 DIAGNOSIS — E785 Hyperlipidemia, unspecified: Secondary | ICD-10-CM | POA: Diagnosis present

## 2023-01-31 DIAGNOSIS — E1169 Type 2 diabetes mellitus with other specified complication: Secondary | ICD-10-CM | POA: Diagnosis not present

## 2023-01-31 DIAGNOSIS — K269 Duodenal ulcer, unspecified as acute or chronic, without hemorrhage or perforation: Secondary | ICD-10-CM

## 2023-01-31 DIAGNOSIS — G4733 Obstructive sleep apnea (adult) (pediatric): Secondary | ICD-10-CM | POA: Diagnosis present

## 2023-01-31 DIAGNOSIS — Z96659 Presence of unspecified artificial knee joint: Secondary | ICD-10-CM | POA: Diagnosis present

## 2023-01-31 DIAGNOSIS — K921 Melena: Secondary | ICD-10-CM

## 2023-01-31 DIAGNOSIS — K2100 Gastro-esophageal reflux disease with esophagitis, without bleeding: Secondary | ICD-10-CM | POA: Diagnosis present

## 2023-01-31 DIAGNOSIS — K264 Chronic or unspecified duodenal ulcer with hemorrhage: Secondary | ICD-10-CM | POA: Diagnosis not present

## 2023-01-31 DIAGNOSIS — Z96619 Presence of unspecified artificial shoulder joint: Secondary | ICD-10-CM | POA: Diagnosis present

## 2023-01-31 DIAGNOSIS — Z87891 Personal history of nicotine dependence: Secondary | ICD-10-CM

## 2023-01-31 DIAGNOSIS — K449 Diaphragmatic hernia without obstruction or gangrene: Secondary | ICD-10-CM | POA: Diagnosis present

## 2023-01-31 DIAGNOSIS — E119 Type 2 diabetes mellitus without complications: Secondary | ICD-10-CM | POA: Diagnosis present

## 2023-01-31 DIAGNOSIS — D500 Iron deficiency anemia secondary to blood loss (chronic): Secondary | ICD-10-CM | POA: Diagnosis present

## 2023-01-31 DIAGNOSIS — E039 Hypothyroidism, unspecified: Secondary | ICD-10-CM | POA: Diagnosis present

## 2023-01-31 DIAGNOSIS — Z885 Allergy status to narcotic agent status: Secondary | ICD-10-CM

## 2023-01-31 DIAGNOSIS — Z79890 Hormone replacement therapy (postmenopausal): Secondary | ICD-10-CM

## 2023-01-31 DIAGNOSIS — K219 Gastro-esophageal reflux disease without esophagitis: Secondary | ICD-10-CM | POA: Diagnosis present

## 2023-01-31 LAB — CBC WITH DIFFERENTIAL/PLATELET
Abs Immature Granulocytes: 0.14 10*3/uL — ABNORMAL HIGH (ref 0.00–0.07)
Basophils Absolute: 0.1 10*3/uL (ref 0.0–0.1)
Basophils Relative: 1 %
Eosinophils Absolute: 0.2 10*3/uL (ref 0.0–0.5)
Eosinophils Relative: 2 %
HCT: 24.8 % — ABNORMAL LOW (ref 39.0–52.0)
Hemoglobin: 8.2 g/dL — ABNORMAL LOW (ref 13.0–17.0)
Immature Granulocytes: 2 %
Lymphocytes Relative: 24 %
Lymphs Abs: 2.2 10*3/uL (ref 0.7–4.0)
MCH: 32 pg (ref 26.0–34.0)
MCHC: 33.1 g/dL (ref 30.0–36.0)
MCV: 96.9 fL (ref 80.0–100.0)
Monocytes Absolute: 1 10*3/uL (ref 0.1–1.0)
Monocytes Relative: 11 %
Neutro Abs: 5.7 10*3/uL (ref 1.7–7.7)
Neutrophils Relative %: 60 %
Platelets: 321 10*3/uL (ref 150–400)
RBC: 2.56 MIL/uL — ABNORMAL LOW (ref 4.22–5.81)
RDW: 14.4 % (ref 11.5–15.5)
WBC: 9.2 10*3/uL (ref 4.0–10.5)
nRBC: 0 % (ref 0.0–0.2)

## 2023-01-31 LAB — COMPREHENSIVE METABOLIC PANEL
ALT: 50 U/L — ABNORMAL HIGH (ref 0–44)
AST: 39 U/L (ref 15–41)
Albumin: 3.5 g/dL (ref 3.5–5.0)
Alkaline Phosphatase: 56 U/L (ref 38–126)
Anion gap: 9 (ref 5–15)
BUN: 9 mg/dL (ref 8–23)
CO2: 23 mmol/L (ref 22–32)
Calcium: 8.5 mg/dL — ABNORMAL LOW (ref 8.9–10.3)
Chloride: 104 mmol/L (ref 98–111)
Creatinine, Ser: 0.86 mg/dL (ref 0.61–1.24)
GFR, Estimated: >60 mL/min (ref >60)
Glucose, Bld: 158 mg/dL — ABNORMAL HIGH (ref 70–99)
Potassium: 3.8 mmol/L (ref 3.5–5.1)
Sodium: 136 mmol/L (ref 135–145)
Total Bilirubin: 0.4 mg/dL (ref 0.3–1.2)
Total Protein: 5.7 g/dL — ABNORMAL LOW (ref 6.5–8.1)

## 2023-01-31 LAB — PROTIME-INR
INR: 1.1 (ref 0.8–1.2)
Prothrombin Time: 14.4 seconds (ref 11.4–15.2)

## 2023-01-31 LAB — HEMOGLOBIN A1C
Hgb A1c MFr Bld: 5.5 % (ref 4.8–5.6)
Mean Plasma Glucose: 111.15 mg/dL

## 2023-01-31 LAB — HEMOGLOBIN AND HEMATOCRIT, BLOOD
HCT: 24.7 % — ABNORMAL LOW (ref 39.0–52.0)
Hemoglobin: 8.5 g/dL — ABNORMAL LOW (ref 13.0–17.0)

## 2023-01-31 LAB — POC OCCULT BLOOD, ED: Fecal Occult Bld: POSITIVE — AB

## 2023-01-31 LAB — ABO/RH: ABO/RH(D): O POS

## 2023-01-31 LAB — GLUCOSE, CAPILLARY: Glucose-Capillary: 137 mg/dL — ABNORMAL HIGH (ref 70–99)

## 2023-01-31 LAB — LIPASE, BLOOD: Lipase: 51 U/L (ref 11–51)

## 2023-01-31 LAB — CBG MONITORING, ED: Glucose-Capillary: 138 mg/dL — ABNORMAL HIGH (ref 70–99)

## 2023-01-31 MED ORDER — INSULIN GLARGINE-YFGN 100 UNIT/ML ~~LOC~~ SOLN
7.0000 [IU] | Freq: Every day | SUBCUTANEOUS | Status: DC
  Administered 2023-02-01 – 2023-02-02 (×2): 7 [IU] via SUBCUTANEOUS
  Filled 2023-01-31 (×2): qty 0.07

## 2023-01-31 MED ORDER — IOHEXOL 9 MG/ML PO SOLN
500.0000 mL | ORAL | Status: AC
  Administered 2023-01-31 (×2): 500 mL via ORAL

## 2023-01-31 MED ORDER — ENALAPRIL MALEATE 10 MG PO TABS: 10.0000 mg | ORAL_TABLET | Freq: Every day | ORAL | Status: DC

## 2023-01-31 MED ORDER — HYDRALAZINE HCL 20 MG/ML IJ SOLN: 10.0000 mg | INTRAMUSCULAR | Status: DC | PRN

## 2023-01-31 MED ORDER — ESCITALOPRAM OXALATE 10 MG PO TABS
10.0000 mg | ORAL_TABLET | Freq: Every day | ORAL | Status: DC
  Administered 2023-02-01 – 2023-02-02 (×2): 10 mg via ORAL
  Filled 2023-01-31 (×2): qty 1

## 2023-01-31 MED ORDER — LATANOPROST 0.005 % OP SOLN
1.0000 [drp] | Freq: Every day | OPHTHALMIC | Status: DC
  Administered 2023-01-31 – 2023-02-01 (×2): 1 [drp] via OPHTHALMIC
  Filled 2023-01-31: qty 2.5

## 2023-01-31 MED ORDER — ONDANSETRON HCL 4 MG/2ML IJ SOLN: 4.0000 mg | Freq: Four times a day (QID) | INTRAMUSCULAR | Status: DC | PRN

## 2023-01-31 MED ORDER — ACETAMINOPHEN 650 MG RE SUPP: 650.0000 mg | Freq: Four times a day (QID) | RECTAL | Status: DC | PRN

## 2023-01-31 MED ORDER — SODIUM CHLORIDE 0.9% FLUSH
3.0000 mL | Freq: Two times a day (BID) | INTRAVENOUS | Status: DC
  Administered 2023-01-31 – 2023-02-01 (×3): 3 mL via INTRAVENOUS

## 2023-01-31 MED ORDER — PANTOPRAZOLE SODIUM 40 MG IV SOLR
40.0000 mg | Freq: Two times a day (BID) | INTRAVENOUS | Status: DC
  Administered 2023-01-31 – 2023-02-02 (×4): 40 mg via INTRAVENOUS
  Filled 2023-01-31 (×4): qty 10

## 2023-01-31 MED ORDER — ATORVASTATIN CALCIUM 10 MG PO TABS
20.0000 mg | ORAL_TABLET | Freq: Every day | ORAL | Status: DC
  Administered 2023-02-01 – 2023-02-02 (×2): 20 mg via ORAL
  Filled 2023-01-31 (×2): qty 2

## 2023-01-31 MED ORDER — LEVOTHYROXINE SODIUM 75 MCG PO TABS
150.0000 ug | ORAL_TABLET | Freq: Every day | ORAL | Status: DC
  Administered 2023-02-01 – 2023-02-02 (×2): 150 ug via ORAL
  Filled 2023-01-31 (×2): qty 2

## 2023-01-31 MED ORDER — LATANOPROST 0.005 % OP EMUL: 1.0000 [drp] | Freq: Every day | OPHTHALMIC | Status: DC

## 2023-01-31 MED ORDER — INSULIN ASPART 100 UNIT/ML IJ SOLN: 0.0000 [IU] | Freq: Every day | INTRAMUSCULAR | Status: DC

## 2023-01-31 MED ORDER — ALBUTEROL SULFATE (2.5 MG/3ML) 0.083% IN NEBU: 2.5000 mg | INHALATION_SOLUTION | Freq: Four times a day (QID) | RESPIRATORY_TRACT | Status: DC | PRN

## 2023-01-31 MED ORDER — ONDANSETRON HCL 4 MG PO TABS: 4.0000 mg | ORAL_TABLET | Freq: Four times a day (QID) | ORAL | Status: DC | PRN

## 2023-01-31 MED ORDER — TRAMADOL HCL 50 MG PO TABS: 50.0000 mg | ORAL_TABLET | Freq: Four times a day (QID) | ORAL | Status: DC | PRN

## 2023-01-31 MED ORDER — INSULIN ASPART 100 UNIT/ML IJ SOLN
0.0000 [IU] | Freq: Three times a day (TID) | INTRAMUSCULAR | Status: DC
  Administered 2023-02-01: 1 [IU] via SUBCUTANEOUS
  Administered 2023-02-01: 3 [IU] via SUBCUTANEOUS
  Administered 2023-02-02: 1 [IU] via SUBCUTANEOUS
  Administered 2023-02-02: 2 [IU] via SUBCUTANEOUS

## 2023-01-31 MED ORDER — CALCIUM GLUCONATE-NACL 1-0.675 GM/50ML-% IV SOLN
1.0000 g | Freq: Once | INTRAVENOUS | Status: AC
  Administered 2023-01-31: 1000 mg via INTRAVENOUS
  Filled 2023-01-31: qty 50

## 2023-01-31 MED ORDER — ACETAMINOPHEN 325 MG PO TABS: 650.0000 mg | ORAL_TABLET | Freq: Four times a day (QID) | ORAL | Status: DC | PRN

## 2023-01-31 MED ORDER — IOHEXOL 350 MG/ML SOLN
75.0000 mL | Freq: Once | INTRAVENOUS | Status: AC | PRN
  Administered 2023-01-31: 75 mL via INTRAVENOUS

## 2023-01-31 NOTE — H&P (Addendum)
History and Physical    Patient: Dean Zimmerman DJS:[id number] DOB: 06-May-1951 DOA: 01/31/2023 DOS: the patient was seen and examined on 01/31/2023 PCP: Glenis Smoker, MD  Patient coming from: Home  Chief Complaint:  Chief Complaint  Patient presents with   Anemia   HPI: Dean Zimmerman is a 72 y.o. male with medical history significant of hypertension, hyperlipidemia, diabetes mellitus type 2, and arthritis who presents with complaints of weakness and anemia.  Over the last 2 weeks or so patient reports that he has had crampy intermittent abdominal pain in the lower part of his abdomen as well as in the middle.  Reports having episodes of vomiting, constipation, and diarrhea.  Emesis had been reported to be nonbloody in appearance.  He was seen in the ED for symptoms from urgent care on 1/29.  While in the ED he received IV fluids and antiemetics with improvement in symptoms and was recommended to stop MiraLAX as it was thought to be causing his diarrhea.  He had been seen by his endocrinologist Dr. Buddy Duty on 1/31 and advised to stop metformin and Ozempic as there was concern that these were causing his abdominal complaints.  He followed up with his primary care provider yesterday who checked lab work and was told his hemoglobin was down to 7.8 g/dL.  Patient reports that he has significant arthritis of the right knee for which she is scheduled to have it replaced in March.  He previously had been taking Tylenol for symptoms, but was noted to have elevated liver enzymes previously.  Therefore patient stopped taking Tylenol and had been using ibuprofen 4 tablets (possibly 200 mg )/day to treat symptoms.  Over the last 2 days or so patient reports that his stools have been black in color.  He has been feeling weak and lightheaded with any movement.  Denies any loss of consciousness  The emergency department patient was noted to have stable vital signs.  Labs significant for hemoglobin 8.2(previously  hemoglobin 10.7 on 1/29) and calcium 8.5.  Stool guaiacs noted to be positive.  Patient was typed and screened for possible need of blood products.  TRH called to admit.  Review of Systems: As mentioned in the history of present illness. All other systems reviewed and are negative. Past Medical History:  Diagnosis Date   DM (diabetes mellitus) ([place])    Hyperlipidemia    Hypertension    Sleep apnea    Past Surgical History:  Procedure Laterality Date   HERNIA REPAIR     REPLACEMENT TOTAL KNEE     TOTAL SHOULDER REPLACEMENT     Social History:  reports that he has quit smoking. His smoking use included cigarettes. He has never used smokeless tobacco. He reports that he does not use drugs. No history on file for alcohol use.  Allergies  Allergen Reactions   Hydrocodone Hives   Morphine Itching and Swelling   Oxycontin [Oxycodone Hcl] Hives    Family History  Problem Relation Age of Onset   Aortic aneurysm Mother     Prior to Admission medications   Medication Sig Start Date End Date Taking? Authorizing Provider  aspirin 81 MG tablet Take 81 mg by mouth daily.   Yes [provider]  atorvastatin (LIPITOR) 20 MG tablet Take 20 mg by mouth daily.   Yes [provider]  Cholecalciferol (VITAMIN D) 125 MCG (5000 UT) CAPS Take 500 Units by mouth daily.   Yes [provider]  enalapril (VASOTEC) 10 MG tablet  Take 10 mg by mouth daily.   Yes [provider]  escitalopram (LEXAPRO) 10 MG tablet Take 10 mg by mouth daily.   Yes [provider]  FARXIGA 5 MG TABS tablet Take 5 mg by mouth daily. 05/24/21  Yes [provider]  insulin glargine (LANTUS) 100 UNIT/ML injection Inject 15 Units into the skin daily.   Yes [provider]  Latanoprost 0.005 % EMUL Place 1 drop into both eyes at bedtime.   Yes [provider]  levothyroxine (SYNTHROID, LEVOTHROID) 150 MCG tablet Take 150 mcg by mouth daily before breakfast.   Yes  [provider]  OMEPRAZOLE PO Take 1 tablet by mouth daily.   Yes [provider]  celecoxib (CELEBREX) 200 MG capsule Take 200 mg by mouth daily. Patient not taking: Reported on 01/31/2023    [provider]  dicyclomine (BENTYL) 10 MG capsule Take 1 capsule (10 mg total) by mouth 4 (four) times daily for 7 days. Patient not taking: Reported on 01/31/2023 05/30/21 06/06/21  Iona Beard, MD  metFORMIN (GLUCOPHAGE-XR) 500 MG 24 hr tablet Take 1,000 mg by mouth daily with supper. Patient not taking: Reported on 01/31/2023    [provider]  metoCLOPramide (REGLAN) 10 MG tablet Take 1 tablet (10 mg total) by mouth every 6 (six) hours as needed for nausea or vomiting. Patient not taking: Reported on 01/31/2023 01/28/23   Molpus, Jenny Reichmann, MD  ondansetron (ZOFRAN-ODT) 8 MG disintegrating tablet Take 1 tablet (8 mg total) by mouth every 8 (eight) hours as needed for nausea or vomiting. Patient not taking: Reported on 01/31/2023 01/28/23   Molpus, John, MD  OZEMPIC, 1 MG/DOSE, 4 MG/3ML SOPN Inject 1 mg into the skin once a week. Sunday Patient not taking: Reported on 01/31/2023 03/18/21   [provider]    Physical Exam: Vitals:   01/31/23 1136  BP: 132/64  Pulse: 89  Resp: 16  Temp: 98.3 F (36.8 C)  SpO2: 100%    Constitutional: Elderly male currently in no acute distress Eyes: PERRL, lids and conjunctivae normal ENMT: Mucous membranes are moist.  Fair dentition Neck: normal, supple  Respiratory: clear to auscultation bilaterally, no wheezing, no crackles. Normal respiratory effort. No accessory muscle use.  Cardiovascular: Regular rate and rhythm, no murmurs / rubs / gallops.  2+ pedal pulses.    Abdomen: no tenderness, no masses palpated.   Bowel sounds positive.  Musculoskeletal: no clubbing / cyanosis.  Crepitation noted of the right knee. Skin: no rashes, lesions, ulcers.  Pallor present. Neurologic: CN 2-12 grossly intact.  Able to move all extremities  psychiatric: Normal judgment and insight. Alert and oriented x 3. Normal mood.   Data Reviewed:  EKG revealed normal sinus rhythm at 77 bpm  Reviewed labs, imaging, and pertinent records as noted above in the HPI  Assessment and Plan: Symptomatic anemia secondary to GI bleed Patient presented with complaints of intermittent abdominal pain, weakness, and black stools.  He had followed back up with his primary care office today after his hemoglobin was noted to be 7.8 g/dL yesterday.  Hemoglobin 8.2 g/dL on check here in the hospital, but previously had been 10.7 on 1/29 when seen in the ED. he was noted to be aspirating, but also takes Profen on a regular basis due to arthritis pain.  Stool guaiacs were noted to be positive.  Patient was typed and screened for possible need of blood products.  Suspect upper GI bleed. -Admit to a telemetry bed -Monitor  intake and output -Serial monitoring of H&H and transfuse as needed for hemoglobins less than 7 g/dL or clinically symptomatic -Clear liquid diet -Protonix IV 40 mg IV twice daily -Dr. Watt Climes of [facility] consulted, will follow-up for any further recommendations  Hypocalcemia Acute.  Initial calcium 8.5 with albumin noted to be within normal limits. -Give 1 g of calcium gluconate IV -Continue monitor and replace as needed  Diabetes mellitus type 2, with long-term use of insulin Home medication regimen includes Lantus 15 units daily and Farxiga 5 mg daily.  He had taken this already today. plan metformin and Ozempic had been placed on hold by patient's endocrinologist Dr. Buddy Duty. -Hypoglycemia protocols - pharmacy substitution of Semglee reduced to 7 units tomorrow in case of need of procedure -CBGs before every meal with sensitive SSI  Hypothyroidism -Check TSH -Continue levothyroxine  Essential hypertension Blood pressures noted to be 116/61, but normally patient is hypertensive. -Hold enalapril.  Determine when medically appropriate to  resume  Hyperlipidemia -Continue atorvastatin -Continue outpatient monitoring of LFTs  Arthritis  Patient reports having significant arthritis of the right knee for which she is scheduled to have an knee replacement surgery in March of this year. -Tramadol as needed for pain  GERD -Pharmacy substitution of Protonix  DVT prophylaxis: SCDs Advance Care Planning:   Code Status: Full Code    Consults: [facility]  Family Communication: Wife updated at bedside  Severity of Illness: The appropriate patient status for this patient is OBSERVATION. Observation status is judged to be reasonable and necessary in order to provide the required intensity of service to ensure the patient's safety. The patient's presenting symptoms, physical exam findings, and initial radiographic and laboratory data in the context of their medical condition is felt to place them at decreased risk for further clinical deterioration. Furthermore, it is anticipated that the patient will be medically stable for discharge from the hospital within 2 midnights of admission.   Author: Norval Morton, MD 01/31/2023 3:39 PM  For on call review [email or web address].

## 2023-01-31 NOTE — ED Notes (Signed)
..ED TO INPATIENT HANDOFF REPORT  ED Nurse Name and Phone #: [phone]  S Name/Age/Gender Dean Zimmerman 72 y.o. male Room/Bed: 014C/014C  Code Status   Code Status: Full Code  Home/SNF/Other Home Patient oriented to: self, place, time, and situation Is this baseline? Yes   Triage Complete: Triage complete  Chief Complaint Symptomatic anemia [D64.9]  Triage Note Patient referred to ED for evaluation of intermittent abdominal pain, generalized weakness, pale skin, and anemia with Hgb 7.8 yesterday. Patient denies bright red bleeding but report some black tarry stools. Patient is alert, oriented, ambulating independently with steady gait, and is in no apparent distress at this time. Patient denies pain at this time.   Allergies Allergies  Allergen Reactions   Hydrocodone Hives   Morphine Itching and Swelling   Oxycontin [Oxycodone Hcl] Hives    Level of Care/Admitting Diagnosis ED Disposition     ED Disposition  Admit   Condition  --   Comment  Hospital Area: [facility] [[id number]]  Level of Care: Telemetry Medical [104]  May place patient in observation at [facility] or [facility] if equivalent level of care is available:: No  Covid Evaluation: Asymptomatic - no recent exposure (last 10 days) testing not required  Diagnosis: Symptomatic anemia [0539767]  Admitting Physician: Norval Morton [[id number]]  Attending Physician: Norval Morton [[id number]]          B Medical/Surgery History Past Medical History:  Diagnosis Date   DM (diabetes mellitus) ([place])    Hyperlipidemia    Hypertension    Sleep apnea    Past Surgical History:  Procedure Laterality Date   HERNIA REPAIR     REPLACEMENT TOTAL KNEE     TOTAL SHOULDER REPLACEMENT       A IV Location/Drains/Wounds Patient Lines/Drains/Airways Status     Active Line/Drains/Airways     Name Placement date Placement time Site Days   Peripheral IV 01/31/23 20 G Anterior;Left Forearm  01/31/23  1508  Forearm  less than 1            Intake/Output Last 24 hours No intake or output data in the 24 hours ending 01/31/23 1622  Labs/Imaging Results for orders placed or performed during the hospital encounter of 01/31/23 (from the past 48 hour(s))  Comprehensive metabolic panel     Status: Abnormal   Collection Time: 01/31/23 11:37 AM  Result Value Ref Range   Sodium 136 135 - 145 mmol/L   Potassium 3.8 3.5 - 5.1 mmol/L   Chloride 104 98 - 111 mmol/L   CO2 23 22 - 32 mmol/L   Glucose, Bld 158 (H) 70 - 99 mg/dL    Comment: Glucose reference range applies only to samples taken after fasting for at least 8 hours.   BUN 9 8 - 23 mg/dL   Creatinine, Ser 0.86 0.61 - 1.24 mg/dL   Calcium 8.5 (L) 8.9 - 10.3 mg/dL   Total Protein 5.7 (L) 6.5 - 8.1 g/dL   Albumin 3.5 3.5 - 5.0 g/dL   AST 39 15 - 41 U/L   ALT 50 (H) 0 - 44 U/L   Alkaline Phosphatase 56 38 - 126 U/L   Total Bilirubin 0.4 0.3 - 1.2 mg/dL   GFR, Estimated >60 >60 mL/min    Comment: (NOTE) Calculated using the CKD-EPI Creatinine Equation (2021)    Anion gap 9 5 - 15    Comment: Performed at [facility], [place] [address], [place],  [address]  CBC with Differential     Status: Abnormal   Collection Time: 01/31/23 11:37 AM  Result Value Ref Range   WBC 9.2 4.0 - 10.5 K/uL   RBC 2.56 (L) 4.22 - 5.81 MIL/uL   Hemoglobin 8.2 (L) 13.0 - 17.0 g/dL   HCT 24.8 (L) 39.0 - 52.0 %   MCV 96.9 80.0 - 100.0 fL   MCH 32.0 26.0 - 34.0 pg   MCHC 33.1 30.0 - 36.0 g/dL   RDW 14.4 11.5 - 15.5 %   Platelets 321 150 - 400 K/uL   nRBC 0.0 0.0 - 0.2 %   Neutrophils Relative % 60 %   Neutro Abs 5.7 1.7 - 7.7 K/uL   Lymphocytes Relative 24 %   Lymphs Abs 2.2 0.7 - 4.0 K/uL   Monocytes Relative 11 %   Monocytes Absolute 1.0 0.1 - 1.0 K/uL   Eosinophils Relative 2 %   Eosinophils Absolute 0.2 0.0 - 0.5 K/uL   Basophils Relative 1 %   Basophils Absolute 0.1 0.0 - 0.1 K/uL   Immature Granulocytes 2 %   Abs  Immature Granulocytes 0.14 (H) 0.00 - 0.07 K/uL    Comment: Performed at [facility], [address], [place], [place] [address]  Lipase, blood     Status: None   Collection Time: 01/31/23 11:37 AM  Result Value Ref Range   Lipase 51 11 - 51 U/L    Comment: Performed at [facility] [address], [place], [place] [address]  Type and screen [place]     Status: None   Collection Time: 01/31/23 11:46 AM  Result Value Ref Range   ABO/RH(D) O POS    Antibody Screen NEG    Sample Expiration      02/03/2023,2359 Performed at [facility], [place] [address], [place], [place] [address]   POC CBG, ED     Status: Abnormal   Collection Time: 01/31/23 12:27 PM  Result Value Ref Range   Glucose-Capillary 138 (H) 70 - 99 mg/dL    Comment: Glucose reference range applies only to samples taken after fasting for at least 8 hours.  POC occult blood, ED     Status: Abnormal   Collection Time: 01/31/23  2:50 PM  Result Value Ref Range   Fecal Occult Bld POSITIVE (A) NEGATIVE  Protime-INR     Status: None   Collection Time: 01/31/23  3:06 PM  Result Value Ref Range   Prothrombin Time 14.4 11.4 - 15.2 seconds   INR 1.1 0.8 - 1.2    Comment: (NOTE) INR goal varies based on device and disease states. Performed at [facility], [place] [address], [place], [place] [address]    No results found.  Pending Labs Unresulted Labs (From admission, onward)     Start     Ordered   02/01/23 0500  CBC  Tomorrow morning,   R        01/31/23 1613   01/31/23 1700  Hemoglobin and hematocrit, blood  Once,   R        01/31/23 1613   01/31/23 1230  ABO/Rh  Once,   R        01/31/23 1230   01/31/23 1137  Urinalysis, Routine w reflex microscopic -Urine, Clean Catch  Once,   URGENT       Question:  Specimen Source  Answer:  Urine, Clean Catch   01/31/23 1137  Vitals/Pain Today's Vitals   01/31/23 1136 01/31/23 1138  BP: 132/64   Pulse: 89   Resp: 16    Temp: 98.3 F (36.8 C)   SpO2: 100%   PainSc:  0-No pain    Isolation Precautions No active isolations  Medications Medications  atorvastatin (LIPITOR) tablet 20 mg (has no administration in time range)  enalapril (VASOTEC) tablet 10 mg (has no administration in time range)  escitalopram (LEXAPRO) tablet 10 mg (has no administration in time range)  levothyroxine (SYNTHROID) tablet 150 mcg (has no administration in time range)  pantoprazole (PROTONIX) injection 40 mg (has no administration in time range)  Latanoprost 0.005 % EMUL 1 drop (has no administration in time range)  sodium chloride flush (NS) 0.9 % injection 3 mL (has no administration in time range)  acetaminophen (TYLENOL) tablet 650 mg (has no administration in time range)    Or  acetaminophen (TYLENOL) suppository 650 mg (has no administration in time range)  ondansetron (ZOFRAN) tablet 4 mg (has no administration in time range)    Or  ondansetron (ZOFRAN) injection 4 mg (has no administration in time range)  albuterol (PROVENTIL) (2.5 MG/3ML) 0.083% nebulizer solution 2.5 mg (has no administration in time range)    Mobility walks     Focused Assessments Cardiac Assessment Handoff:    No results found for: "CKTOTAL", "CKMB", "CKMBINDEX", "TROPONINI" No results found for: "DDIMER" Does the Patient currently have chest pain? No   , Neuro Assessment Handoff:  Swallow screen pass? Yes          Neuro Assessment:   Neuro Checks:      Has TPA been given? No If patient is a Neuro Trauma and patient is going to OR before floor call report to [place] nurse: [phone] or [phone]  , Pulmonary Assessment Handoff:  Lung sounds:   O2 Device: Room Air      R Recommendations: See Admitting Provider Note  Report given to:   Additional Notes: NA

## 2023-01-31 NOTE — Consult Note (Signed)
Reason for Consult: Abdominal pain weight loss anemia guaiac positivity Referring Physician: Hospital team  Dean Zimmerman is an 72 y.o. male.  HPI: Patient seen and examined and his office computer chart and his hospital computer chart was reviewed and his case discussed with his wife who is a patient of mine and he has lost about 100 pounds in the last year although he has been trying to and he does take an aspirin a day and has been on a moderate amount of nonsteroidals lately and in the last 2 weeks he has had lower abdominal pain with alternating diarrhea and constipation in the last 3 days it has been black but not diarrhea and he had a colonoscopy 15 years ago in [place] but no other obvious GI workup and his family history is negative from a GI standpoint and in our office he has had yearly guaiacs which have been okay and he has no other complaints Past Medical History:  Diagnosis Date   DM (diabetes mellitus) ([place])    Hyperlipidemia    Hypertension    Sleep apnea     Past Surgical History:  Procedure Laterality Date   HERNIA REPAIR     REPLACEMENT TOTAL KNEE     TOTAL SHOULDER REPLACEMENT      Family History  Problem Relation Age of Onset   Aortic aneurysm Mother     Social History:  reports that he has quit smoking. His smoking use included cigarettes. He has never used smokeless tobacco. He reports that he does not use drugs. No history on file for alcohol use.  Allergies:  Allergies  Allergen Reactions   Hydrocodone Hives   Morphine Itching and Swelling   Oxycontin [Oxycodone Hcl] Hives    Medications: I have reviewed the patient's current medications.  Results for orders placed or performed during the hospital encounter of 01/31/23 (from the past 48 hour(s))  Comprehensive metabolic panel     Status: Abnormal   Collection Time: 01/31/23 11:37 AM  Result Value Ref Range   Sodium 136 135 - 145 mmol/L   Potassium 3.8 3.5 - 5.1 mmol/L   Chloride 104 98 - 111  mmol/L   CO2 23 22 - 32 mmol/L   Glucose, Bld 158 (H) 70 - 99 mg/dL    Comment: Glucose reference range applies only to samples taken after fasting for at least 8 hours.   BUN 9 8 - 23 mg/dL   Creatinine, Ser 0.86 0.61 - 1.24 mg/dL   Calcium 8.5 (L) 8.9 - 10.3 mg/dL   Total Protein 5.7 (L) 6.5 - 8.1 g/dL   Albumin 3.5 3.5 - 5.0 g/dL   AST 39 15 - 41 U/L   ALT 50 (H) 0 - 44 U/L   Alkaline Phosphatase 56 38 - 126 U/L   Total Bilirubin 0.4 0.3 - 1.2 mg/dL   GFR, Estimated >60 >60 mL/min    Comment: (NOTE) Calculated using the CKD-EPI Creatinine Equation (2021)    Anion gap 9 5 - 15    Comment: Performed at [facility], [place] [address] [place],  [address]  CBC with Differential     Status: Abnormal   Collection Time: 01/31/23 11:37 AM  Result Value Ref Range   WBC 9.2 4.0 - 10.5 K/uL   RBC 2.56 (L) 4.22 - 5.81 MIL/uL   Hemoglobin 8.2 (L) 13.0 - 17.0 g/dL   HCT 24.8 (L) 39.0 - 52.0 %   MCV 96.9 80.0 - 100.0 fL  MCH 32.0 26.0 - 34.0 pg   MCHC 33.1 30.0 - 36.0 g/dL   RDW 14.4 11.5 - 15.5 %   Platelets 321 150 - 400 K/uL   nRBC 0.0 0.0 - 0.2 %   Neutrophils Relative % 60 %   Neutro Abs 5.7 1.7 - 7.7 K/uL   Lymphocytes Relative 24 %   Lymphs Abs 2.2 0.7 - 4.0 K/uL   Monocytes Relative 11 %   Monocytes Absolute 1.0 0.1 - 1.0 K/uL   Eosinophils Relative 2 %   Eosinophils Absolute 0.2 0.0 - 0.5 K/uL   Basophils Relative 1 %   Basophils Absolute 0.1 0.0 - 0.1 K/uL   Immature Granulocytes 2 %   Abs Immature Granulocytes 0.14 (H) 0.00 - 0.07 K/uL    Comment: Performed at [facility] [address], [place], [place] [address]  Lipase, blood     Status: None   Collection Time: 01/31/23 11:37 AM  Result Value Ref Range   Lipase 51 11 - 51 U/L    Comment: Performed at [facility] [address], [place], [place] [address]  Type and screen [place]     Status: None   Collection Time: 01/31/23 11:46 AM  Result Value Ref Range    ABO/RH(D) O POS    Antibody Screen NEG    Sample Expiration      02/03/2023,2359 Performed at [facility], [place] [address] [place], [place] [address]   POC CBG, ED     Status: Abnormal   Collection Time: 01/31/23 12:27 PM  Result Value Ref Range   Glucose-Capillary 138 (H) 70 - 99 mg/dL    Comment: Glucose reference range applies only to samples taken after fasting for at least 8 hours.  POC occult blood, ED     Status: Abnormal   Collection Time: 01/31/23  2:50 PM  Result Value Ref Range   Fecal Occult Bld POSITIVE (A) NEGATIVE  Protime-INR     Status: None   Collection Time: 01/31/23  3:06 PM  Result Value Ref Range   Prothrombin Time 14.4 11.4 - 15.2 seconds   INR 1.1 0.8 - 1.2    Comment: (NOTE) INR goal varies based on device and disease states. Performed at [facility], [place] [address] [place], [place] [address]     No results found.  ROS negative except above Blood pressure 129/70, pulse 71, temperature 98.3 F (36.8 C), temperature source Oral, resp. rate 18, SpO2 97 %. Physical Exam vital signs stable afebrile no acute distress abdomen is soft nontender labs reviewed BUN and creatinine okay hemoglobin decreased MCV okay  Assessment/Plan: Multiple medical problems including anemia guaiac positivity and 2 weeks of lower abdominal pain Plan: Will begin workup with a CT scan just to rule out anything significant and then probably an endoscopy while he is here and assuming nothing else is found on the CT scan we discussed how he needs an outpatient colonoscopy in the near future however if CT implies a colon lesion happy to proceed before he goes home and agree with clear liquids for now and we discussed the aspirin and nonsteroidals probably playing a role with the bleeding and will need to reassess his aspirin needs during this hospital stay and he should decrease nonsteroidals as well and we will review CT and decide how to proceed and call me sooner if signs  of active bleeding or other GI question or problem  [facility]  E 01/31/2023, 6:22 PM

## 2023-01-31 NOTE — ED Provider Notes (Signed)
[facility] Provider Note   CSN: [id number] Arrival date & time: 01/31/23  1104     History  Chief Complaint  Patient presents with   Anemia    Dean Zimmerman is a 72 y.o. male.   Anemia   72 year old male presents emergency department with complaints of anemia. Patient was seen by primary care through [facility] earlier today with repeat hemoglobin of 7.8 performed yesterday. Patient has had 2 weeks history of intermittent abdominal pain, nausea, vomiting. Patient currently without said symptoms but has had 2 to 3-day history of melena. Was sent to the ED by primary care for further assessment due to new onset anemia. Patient complaining of generalized weakness, shortness of breath as well as feelings of lightheadedness.   Past medical history significant for diabetes mellitus, hyperlipidemia, hypertension, OSA  Home Medications Prior to Admission medications   Medication Sig Start Date End Date Taking? Authorizing Provider  aspirin 81 MG tablet Take 81 mg by mouth daily.   Yes [provider]  atorvastatin (LIPITOR) 20 MG tablet Take 20 mg by mouth daily.   Yes [provider]  Cholecalciferol (VITAMIN D) 125 MCG (5000 UT) CAPS Take 500 Units by mouth daily.   Yes [provider]  enalapril (VASOTEC) 10 MG tablet Take 10 mg by mouth daily.   Yes [provider]  escitalopram (LEXAPRO) 10 MG tablet Take 10 mg by mouth daily.   Yes [provider]  FARXIGA 5 MG TABS tablet Take 5 mg by mouth daily. 05/24/21  Yes [provider]  insulin glargine (LANTUS) 100 UNIT/ML injection Inject 15 Units into the skin daily.   Yes [provider]  Latanoprost 0.005 % EMUL Place 1 drop into both eyes at bedtime.   Yes [provider]  levothyroxine (SYNTHROID, LEVOTHROID) 150 MCG tablet Take 150 mcg by mouth daily before breakfast.   Yes [provider]  OMEPRAZOLE PO  Take 1 tablet by mouth daily.   Yes [provider]  dicyclomine (BENTYL) 10 MG capsule Take 1 capsule (10 mg total) by mouth 4 (four) times daily for 7 days. Patient not taking: Reported on 01/31/2023 05/30/21 06/06/21  Iona Beard, MD  metFORMIN (GLUCOPHAGE-XR) 500 MG 24 hr tablet Take 1,000 mg by mouth daily with supper. Patient not taking: Reported on 01/31/2023    [provider]  metoCLOPramide (REGLAN) 10 MG tablet Take 1 tablet (10 mg total) by mouth every 6 (six) hours as needed for nausea or vomiting. Patient not taking: Reported on 01/31/2023 01/28/23   Molpus, Jenny Reichmann, MD  ondansetron (ZOFRAN-ODT) 8 MG disintegrating tablet Take 1 tablet (8 mg total) by mouth every 8 (eight) hours as needed for nausea or vomiting. Patient not taking: Reported on 01/31/2023 01/28/23   Molpus, John, MD  OZEMPIC, 1 MG/DOSE, 4 MG/3ML SOPN Inject 1 mg into the skin once a week. Sunday Patient not taking: Reported on 01/31/2023 03/18/21   [provider]      Allergies    Hydrocodone, Morphine, and Oxycontin [oxycodone hcl]    Review of Systems   Review of Systems  All other systems reviewed and are negative.   Physical Exam Updated Vital Signs BP 129/70 (BP Location: Right Arm)   Pulse 71   Temp 98.3 F (36.8 C) (Oral)   Resp 18   SpO2 97%  Physical Exam Vitals and nursing note reviewed. Exam conducted with a chaperone present.  Constitutional:  General: He is not in acute distress.    Appearance: He is well-developed.  HENT:     Head: Normocephalic and atraumatic.  Eyes:     Conjunctiva/sclera: Conjunctivae normal.  Cardiovascular:     Rate and Rhythm: Normal rate and regular rhythm.     Heart sounds: No murmur heard. Pulmonary:     Effort: Pulmonary effort is normal. No respiratory distress.     Breath sounds: Normal breath sounds. No wheezing or rales.  Abdominal:     Palpations: Abdomen is soft.     Tenderness: There is no abdominal tenderness.  Genitourinary:     Comments: Rectal exam showed minimal amounts of dark-colored stool.  Bright red blood appreciated. Musculoskeletal:        General: No swelling.     Cervical back: Neck supple.  Skin:    General: Skin is warm and dry.     Capillary Refill: Capillary refill takes less than 2 seconds.  Neurological:     Mental Status: He is alert.  Psychiatric:        Mood and Affect: Mood normal.     ED Results / Procedures / Treatments   Labs (all labs ordered are listed, but only abnormal results are displayed) Labs Reviewed  COMPREHENSIVE METABOLIC PANEL - Abnormal; Notable for the following components:      Result Value   Glucose, Bld 158 (*)    Calcium 8.5 (*)    Total Protein 5.7 (*)    ALT 50 (*)    All other components within normal limits  CBC WITH DIFFERENTIAL/PLATELET - Abnormal; Notable for the following components:   RBC 2.56 (*)    Hemoglobin 8.2 (*)    HCT 24.8 (*)    Abs Immature Granulocytes 0.14 (*)    All other components within normal limits  CBG MONITORING, ED - Abnormal; Notable for the following components:   Glucose-Capillary 138 (*)    All other components within normal limits  POC OCCULT BLOOD, ED - Abnormal; Notable for the following components:   Fecal Occult Bld POSITIVE (*)    All other components within normal limits  LIPASE, BLOOD  PROTIME-INR  URINALYSIS, ROUTINE W REFLEX MICROSCOPIC  HEMOGLOBIN AND HEMATOCRIT, BLOOD  CBC  HEMOGLOBIN A1C  TYPE AND SCREEN  ABO/RH    EKG None  Radiology No results found.  Procedures Procedures    Medications Ordered in ED Medications  atorvastatin (LIPITOR) tablet 20 mg (has no administration in time range)  escitalopram (LEXAPRO) tablet 10 mg (has no administration in time range)  levothyroxine (SYNTHROID) tablet 150 mcg (has no administration in time range)  pantoprazole (PROTONIX) injection 40 mg (has no administration in time range)  Latanoprost 0.005 % EMUL 1 drop (has no administration in time range)   sodium chloride flush (NS) 0.9 % injection 3 mL (has no administration in time range)  acetaminophen (TYLENOL) tablet 650 mg (has no administration in time range)    Or  acetaminophen (TYLENOL) suppository 650 mg (has no administration in time range)  ondansetron (ZOFRAN) tablet 4 mg (has no administration in time range)    Or  ondansetron (ZOFRAN) injection 4 mg (has no administration in time range)  albuterol (PROVENTIL) (2.5 MG/3ML) 0.083% nebulizer solution 2.5 mg (has no administration in time range)  calcium gluconate 1 g/ 50 mL sodium chloride IVPB (has no administration in time range)  insulin aspart (novoLOG) injection 0-9 Units (has no administration in time range)  insulin aspart (novoLOG) injection 0-5 Units (  has no administration in time range)    ED Course/ Medical Decision Making/ A&P Clinical Course as of 01/31/23 1747  Fri Jan 31, 2023  1541 [facility] hospital medicine Dr. Tamala Julian who is in agreement with admission of the patient assume further treatment/care. [CR]  1541 Paged gastroenterology x 2 without response. [CR]  1746 Patient was taken to bed prior to GI callback. [CR]    Clinical Course User Index [CR] [name], PA                             Medical Decision Making Amount and/or Complexity of Data Reviewed Labs: ordered.  Risk Decision regarding hospitalization.   This patient presents to the ED for concern of shortness of breath/generalized weakness, this involves an extensive number of treatment options, and is a complaint that carries with it a high risk of complications and morbidity.  The differential diagnosis includes The causes for shortness of breath include but are not limited to Cardiac (AHF, pericardial effusion and tamponade, arrhythmias, ischemia, etc) Respiratory (COPD, asthma, pneumonia, pneumothorax, primary pulmonary hypertension, PE/VQ mismatch) Hematological (anemia)   Co morbidities that complicate the patient  evaluation  See HPI   Additional history obtained:  Additional history obtained from EMR External records from outside source obtained and reviewed including hospital records   Lab Tests:  I Ordered, and personally interpreted labs.  The pertinent results include: Evidence of anemia with hemoglobin 8.2 which has dropped to plus grams since 4 days ago likely GI source.  Anemia seems microcytic in nature.  No evidence of leukocytosis.  Electrolytes within range.  No electrolyte abnormalities appreciated.  No transaminitis.  No renal dysfunction.  Type and screen O+.  POC CBG of 138.  Lipase within normal limits.  PT/INR within normal limits.  Fecal occult blood positive.   Imaging Studies ordered:  N/a   Cardiac Monitoring: / EKG:  The patient was maintained on a cardiac monitor.  I personally viewed and interpreted the cardiac monitored which showed an underlying rhythm of: Sinus rhythm   Consultations Obtained:  See ED course  Problem List / ED Course / Critical interventions / Medication management  Symptomatic anemia/melena Reevaluation of the patient showed that the patient stayed the same I have reviewed the patients home medicines and have made adjustments as needed   Social Determinants of Health:  Former cigarette use.  Denies illicit drug use.   Test / Admission - Considered:  Symptomatic anemia/melena Vitals signs within normal range and stable throughout visit. Laboratory studies significant for: See above Patient with evidence of symptomatic anemia.  Hemoglobin of greater than 7 so patient not given blood while in the emergency department.  Patient is still active bleeding appreciated with positive Hemoccult blood.  Awaiting GI consultation for beginning of PPI.  Plan for admission. Treatment plan were discussed at length with patient and they knowledge understanding was agreeable to said plan.  Appropriate consultations were made as described in the ED  course.  Patient was stable upon admission to the hospital.         Final Clinical Impression(s) / ED Diagnoses Final diagnoses:  Symptomatic anemia  Melena    Rx / DC Orders ED Discharge Orders     None         [name], [place] 01/31/23 1747    Audley Hose, MD 02/01/23 2027

## 2023-01-31 NOTE — ED Provider Triage Note (Signed)
Emergency Medicine Provider Triage Evaluation Note  Dean Zimmerman , a 72 y.o. male  was evaluated in triage.  Pt complains of anemia.  Patient was seen by primary care through Dean Zimmerman earlier today with repeat hemoglobin of 7.8 performed yesterday.  Patient has had 2 weeks history of intermittent abdominal pain, nausea, vomiting.  Patient currently without said symptoms but has had 2 to 3-day history of melena.  Was sent to the ED by primary care for further assessment due to new onset anemia.  Patient complaining of generalized weakness, shortness of breath as well as feelings of lightheadedness.  Review of Systems  Positive: See above Negative:   Physical Exam  BP 132/64 (BP Location: Right Arm)   Pulse 89   Temp 98.3 F (36.8 C)   Resp 16   SpO2 100%  Gen:   Awake, no distress   Resp:  Normal effort  MSK:   Moves extremities without difficulty  Other:  No abdominal tenderness.  Medical Decision Making  Medically screening exam initiated at 11:54 AM.  Appropriate orders placed.  Dean Zimmerman was informed that the remainder of the evaluation will be completed by another provider, this initial triage assessment does not replace that evaluation, and the importance of remaining in the ED until their evaluation is complete.     Dean Zimmerman, [place] 01/31/23 1159

## 2023-01-31 NOTE — ED Triage Notes (Signed)
Patient referred to ED for evaluation of intermittent abdominal pain, generalized weakness, pale skin, and anemia with Hgb 7.8 yesterday. Patient denies bright red bleeding but report some black tarry stools. Patient is alert, oriented, ambulating independently with steady gait, and is in no apparent distress at this time. Patient denies pain at this time.

## 2023-02-01 DIAGNOSIS — Z96619 Presence of unspecified artificial shoulder joint: Secondary | ICD-10-CM | POA: Diagnosis present

## 2023-02-01 DIAGNOSIS — D500 Iron deficiency anemia secondary to blood loss (chronic): Secondary | ICD-10-CM | POA: Diagnosis present

## 2023-02-01 DIAGNOSIS — E119 Type 2 diabetes mellitus without complications: Secondary | ICD-10-CM | POA: Diagnosis present

## 2023-02-01 DIAGNOSIS — K449 Diaphragmatic hernia without obstruction or gangrene: Secondary | ICD-10-CM | POA: Diagnosis present

## 2023-02-01 DIAGNOSIS — Z794 Long term (current) use of insulin: Secondary | ICD-10-CM | POA: Diagnosis not present

## 2023-02-01 DIAGNOSIS — I10 Essential (primary) hypertension: Secondary | ICD-10-CM | POA: Diagnosis present

## 2023-02-01 DIAGNOSIS — K921 Melena: Secondary | ICD-10-CM | POA: Diagnosis present

## 2023-02-01 DIAGNOSIS — E039 Hypothyroidism, unspecified: Secondary | ICD-10-CM | POA: Diagnosis present

## 2023-02-01 DIAGNOSIS — K269 Duodenal ulcer, unspecified as acute or chronic, without hemorrhage or perforation: Secondary | ICD-10-CM | POA: Diagnosis not present

## 2023-02-01 DIAGNOSIS — D649 Anemia, unspecified: Secondary | ICD-10-CM | POA: Diagnosis not present

## 2023-02-01 DIAGNOSIS — G4733 Obstructive sleep apnea (adult) (pediatric): Secondary | ICD-10-CM | POA: Diagnosis present

## 2023-02-01 DIAGNOSIS — K2100 Gastro-esophageal reflux disease with esophagitis, without bleeding: Secondary | ICD-10-CM | POA: Diagnosis present

## 2023-02-01 DIAGNOSIS — K922 Gastrointestinal hemorrhage, unspecified: Secondary | ICD-10-CM | POA: Diagnosis not present

## 2023-02-01 DIAGNOSIS — Z7982 Long term (current) use of aspirin: Secondary | ICD-10-CM | POA: Diagnosis not present

## 2023-02-01 DIAGNOSIS — Z79890 Hormone replacement therapy (postmenopausal): Secondary | ICD-10-CM | POA: Diagnosis not present

## 2023-02-01 DIAGNOSIS — Z87891 Personal history of nicotine dependence: Secondary | ICD-10-CM | POA: Diagnosis not present

## 2023-02-01 DIAGNOSIS — Z96659 Presence of unspecified artificial knee joint: Secondary | ICD-10-CM | POA: Diagnosis present

## 2023-02-01 DIAGNOSIS — K264 Chronic or unspecified duodenal ulcer with hemorrhage: Secondary | ICD-10-CM | POA: Diagnosis present

## 2023-02-01 DIAGNOSIS — E1169 Type 2 diabetes mellitus with other specified complication: Secondary | ICD-10-CM | POA: Diagnosis not present

## 2023-02-01 DIAGNOSIS — Z885 Allergy status to narcotic agent status: Secondary | ICD-10-CM | POA: Diagnosis not present

## 2023-02-01 DIAGNOSIS — E785 Hyperlipidemia, unspecified: Secondary | ICD-10-CM | POA: Diagnosis present

## 2023-02-01 LAB — CBC
HCT: 21 % — ABNORMAL LOW (ref 39.0–52.0)
Hemoglobin: 7.3 g/dL — ABNORMAL LOW (ref 13.0–17.0)
MCH: 32.3 pg (ref 26.0–34.0)
MCHC: 34.8 g/dL (ref 30.0–36.0)
MCV: 92.9 fL (ref 80.0–100.0)
Platelets: 276 10*3/uL (ref 150–400)
RBC: 2.26 MIL/uL — ABNORMAL LOW (ref 4.22–5.81)
RDW: 14.4 % (ref 11.5–15.5)
WBC: 5.9 10*3/uL (ref 4.0–10.5)
nRBC: 0 % (ref 0.0–0.2)

## 2023-02-01 LAB — GLUCOSE, CAPILLARY
Glucose-Capillary: 140 mg/dL — ABNORMAL HIGH (ref 70–99)
Glucose-Capillary: 222 mg/dL — ABNORMAL HIGH (ref 70–99)
Glucose-Capillary: 236 mg/dL — ABNORMAL HIGH (ref 70–99)
Glucose-Capillary: 103 mg/dL — ABNORMAL HIGH (ref 70–99)
Glucose-Capillary: 117 mg/dL — ABNORMAL HIGH (ref 70–99)

## 2023-02-01 LAB — HEMOGLOBIN AND HEMATOCRIT, BLOOD
HCT: 24.5 % — ABNORMAL LOW (ref 39.0–52.0)
Hemoglobin: 8.2 g/dL — ABNORMAL LOW (ref 13.0–17.0)

## 2023-02-01 NOTE — Hospital Course (Signed)
Dean Zimmerman is a 72 yo male with PMH HTN, HLD, DM II, arthritis who presented with weakness and lethargy.  He had been having intermittent cramping abdominal pain in his lower abdomen with associated vomiting and rotating constipation/diarrhea.  He was seen in the ER on 01/27/2023 and underwent hydration and discontinuation of laxatives.  He was also seen by endocrinology on 01/29/2023 and his metformin and Ozempic were discontinued in setting of his abdominal symptoms. He then followed up outpatient with PCP and underwent blood work and was found to have significant anemia with hemoglobin down to 7.8 g/dL. He endorsed being on ibuprofen recently at home due to underlying right knee arthritis.  Also on admission he was endorsing black tarry stools. On admission, hemoglobin 8.5 g/dL which down trended to 7.3 g/dL. GI was consulted for further assistance. EGD was performed on 02/02/2023 which showed grade A reflux esophagitis with no bleeding.  Nonbleeding duodenal ulcer with no stigmata of bleeding found and biopsied.  He was recommended to continue on twice daily Protonix for 1 month then daily. Due to borderline hemoglobin (7.4 g/dL) and symptomatic anemia, he was transfused 1 unit PRBC prior to discharge.

## 2023-02-01 NOTE — Assessment & Plan Note (Addendum)
-   A1c 5.5% on 01/31/2023 - Recently taken off of metformin and Ozempic by endocrinology due to his abdominal complaints which now may be instead due to underlying PUD -Patient to discuss possibly resuming metformin and Ozempic at follow-up

## 2023-02-01 NOTE — Assessment & Plan Note (Signed)
-   Replete as needed 

## 2023-02-01 NOTE — Progress Notes (Signed)
Dean Zimmerman 6:40 PM  Subjective: Patient doing well tolerating liquids no new complaints and no abdominal pain and we reviewed his CTA  Objective: Signs stable afebrile no acute distress abdomen is soft nontender hemoglobin stable no new chemistries CT reviewed  Assessment: Guaiac positive anemia abnormal CT  Plan: Will proceed with endoscopy tomorrow but if nondiagnostic or worrisome findings probably can go home tomorrow with outpatient colonoscopy in the next week or 2 and patient agrees with the plan  [facility]  office [phone] After 5PM or if no answer call [phone]

## 2023-02-01 NOTE — Assessment & Plan Note (Signed)
-   See symptomatic anemia -Patient does not seem to have any significant cardiovascular disease and likely lower benefit from ongoing aspirin use at this time given findings on EGD.  Aspirin discontinued at discharge

## 2023-02-01 NOTE — Assessment & Plan Note (Signed)
-   Home regimen resumed

## 2023-02-01 NOTE — Assessment & Plan Note (Signed)
-   Patient has been using increased amounts of ibuprofen at home prior to admission which may be accounting for anemia

## 2023-02-01 NOTE — Assessment & Plan Note (Signed)
Continue Synthroid °

## 2023-02-01 NOTE — Assessment & Plan Note (Signed)
Continue Protonix °

## 2023-02-01 NOTE — Assessment & Plan Note (Signed)
-  Continue Lipitor °

## 2023-02-01 NOTE — Progress Notes (Signed)
Progress Note    Dean Zimmerman   KCL:[id number]  DOB: April 04, 1951  DOA: 01/31/2023     0 PCP: Glenis Smoker, MD  Initial CC: abdominal pain, low Hgb  Hospital Course: Dean Zimmerman is a 72 yo male with PMH HTN, HLD, DM II, arthritis who presented with weakness and lethargy.  He had been having intermittent cramping abdominal pain in his lower abdomen with associated vomiting and rotating constipation/diarrhea.  He was seen in the ER on 01/27/2023 and underwent hydration and discontinuation of laxatives.  He was also seen by endocrinology on 01/29/2023 and his metformin and Ozempic were discontinued in setting of his abdominal symptoms. He then followed up outpatient with PCP and underwent blood work and was found to have significant anemia with hemoglobin down to 7.8 g/dL. He endorsed being on ibuprofen recently at home due to underlying right knee arthritis.  Also on admission he was endorsing black tarry stools. On admission, hemoglobin 8.5 g/dL which down trended to 7.3 g/dL. GI was consulted for further assistance.  Interval History:  Resting in bed when seen this morning.  Abdominal pain has improved some since admission.  He is amenable for workup including EGD and possible colonoscopy if necessary.  Assessment and Plan: * Symptomatic anemia - Suspected upper GIB in setting of NSAID use.  No other obvious risk factors at this time - GI following, tentative plan for EGD on Sunday - Continue Protonix - Continue trending hemoglobin, if any further downtrend, will transfuse as he is mildly symptomatic  GI bleed - See symptomatic anemia - Continue trending hemoglobin  DMII (diabetes mellitus, type 2) (HCC) - A1c 5.5% on 01/31/2023 - Recently taken off of metformin and Ozempic by endocrinology due to his abdominal complaints which now may be instead due to underlying PUD - Continue Semglee and SSI.  Will adjust further as necessary - May be able to resume metformin and Ozempic  depending on EGD findings  Hypothyroidism - Continue Synthroid  Essential hypertension - BP regimen on hold in setting of GIB  Hyperlipidemia - Continue Lipitor  Arthritis - Patient has been using increased amounts of ibuprofen at home prior to admission which may be accounting for anemia  GERD (gastroesophageal reflux disease) - Continue Protonix  Hypocalcemia - Replete as needed   Old records reviewed in assessment of this patient  Antimicrobials:   DVT prophylaxis:  SCDs Start: 01/31/23 1607   Code Status:   Code Status: Full Code  Mobility Assessment (last 72 hours)     Mobility Assessment     Row Name 02/01/23 0847 01/31/23 1723         Does patient have an order for bedrest or is patient medically unstable No - Continue assessment No - Continue assessment      What is the highest level of mobility based on the progressive mobility assessment? Level 6 (Walks independently in room and hall) - Balance while walking in room without assist - Complete Level 5 (Walks with assist in room/hall) - Balance while stepping forward/back and can walk in room with assist - Complete               Barriers to discharge:  Disposition Plan: Home 1 to 2 days Status is: Observation, needs inpatient  Objective: Blood pressure 121/61, pulse 77, temperature 98.2 F (36.8 C), temperature source Oral, resp. rate 16, SpO2 100 %.  Examination: Physical Exam Constitutional:      General: He is not in acute distress.  Appearance: Normal appearance.  HENT:     Head: Normocephalic and atraumatic.     Mouth/Throat:     Mouth: Mucous membranes are moist.  Eyes:     Extraocular Movements: Extraocular movements intact.  Cardiovascular:     Rate and Rhythm: Normal rate and regular rhythm.     Heart sounds: Normal heart sounds.  Pulmonary:     Effort: Pulmonary effort is normal. No respiratory distress.     Breath sounds: Normal breath sounds. No wheezing.  Abdominal:      General: Bowel sounds are normal. There is no distension.     Palpations: Abdomen is soft.     Tenderness: There is no abdominal tenderness.  Musculoskeletal:        General: Normal range of motion.     Cervical back: Normal range of motion and neck supple.  Skin:    General: Skin is warm and dry.  Neurological:     General: No focal deficit present.     Mental Status: He is alert.  Psychiatric:        Mood and Affect: Mood normal.        Behavior: Behavior normal.      Consultants:  GI  Procedures:  Tentative EGD, 02/02/2023  Data Reviewed: Results for orders placed or performed during the hospital encounter of 01/31/23 (from the past 24 hour(s))  POC occult blood, ED     Status: Abnormal   Collection Time: 01/31/23  2:50 PM  Result Value Ref Range   Fecal Occult Bld POSITIVE (A) NEGATIVE  Protime-INR     Status: None   Collection Time: 01/31/23  3:06 PM  Result Value Ref Range   Prothrombin Time 14.4 11.4 - 15.2 seconds   INR 1.1 0.8 - 1.2  Hemoglobin and hematocrit, blood     Status: Abnormal   Collection Time: 01/31/23  6:18 PM  Result Value Ref Range   Hemoglobin 8.5 (L) 13.0 - 17.0 g/dL   HCT 24.7 (L) 39.0 - 52.0 %  Hemoglobin A1c     Status: None   Collection Time: 01/31/23  6:18 PM  Result Value Ref Range   Hgb A1c MFr Bld 5.5 4.8 - 5.6 %   Mean Plasma Glucose 111.15 mg/dL  ABO/Rh     Status: None   Collection Time: 01/31/23  6:19 PM  Result Value Ref Range   ABO/RH(D)      O POS Performed at [facility], [place] [address], [place], [place] [address]   Glucose, capillary     Status: Abnormal   Collection Time: 01/31/23 10:35 PM  Result Value Ref Range   Glucose-Capillary 137 (H) 70 - 99 mg/dL  CBC     Status: Abnormal   Collection Time: 02/01/23  1:35 AM  Result Value Ref Range   WBC 5.9 4.0 - 10.5 K/uL   RBC 2.26 (L) 4.22 - 5.81 MIL/uL   Hemoglobin 7.3 (L) 13.0 - 17.0 g/dL   HCT 21.0 (L) 39.0 - 52.0 %   MCV 92.9 80.0 - 100.0 fL   MCH 32.3 26.0  - 34.0 pg   MCHC 34.8 30.0 - 36.0 g/dL   RDW 14.4 11.5 - 15.5 %   Platelets 276 150 - 400 K/uL   nRBC 0.0 0.0 - 0.2 %  Glucose, capillary     Status: Abnormal   Collection Time: 02/01/23  7:57 AM  Result Value Ref Range   Glucose-Capillary 140 (H) 70 - 99 mg/dL  Glucose,  capillary     Status: Abnormal   Collection Time: 02/01/23 11:52 AM  Result Value Ref Range   Glucose-Capillary 222 (H) 70 - 99 mg/dL   Comment 1 Notify RN   Hemoglobin and hematocrit, blood     Status: Abnormal   Collection Time: 02/01/23 12:41 PM  Result Value Ref Range   Hemoglobin 8.2 (L) 13.0 - 17.0 g/dL   HCT 24.5 (L) 39.0 - 52.0 %  Glucose, capillary     Status: Abnormal   Collection Time: 02/01/23  2:00 PM  Result Value Ref Range   Glucose-Capillary 236 (H) 70 - 99 mg/dL    I have reviewed pertinent nursing notes, vitals, labs, and images as necessary. I have ordered labwork to follow up on as indicated.  I have reviewed the last notes from staff over past 24 hours. I have discussed patient's care plan and test results with nursing staff, CM/SW, and other staff as appropriate.  Time spent: Greater than 50% of the 55 minute visit was spent in counseling/coordination of care for the patient as laid out in the A&P.   LOS: 0 days   Dwyane Dee, MD [facility] 02/01/2023, 2:48 PM

## 2023-02-01 NOTE — Care Management Obs Status (Signed)
[facility] NOTIFICATION   Patient Details  Name: Amelio Brosky MRN: [id number] Date of Birth: [birth year]   Medicare Observation Status Notification Given:  Yes    Tom-Johnson, Renea Ee, RN 02/01/2023, 3:44 PM

## 2023-02-01 NOTE — Assessment & Plan Note (Signed)
-   Suspected upper GIB in setting of NSAID use.  No other obvious risk factors at this time - GI following, underwent EGD on 02/02/2023 - Continue Protonix -Hemoglobin 7.4 g/dL on 02/02/2023.  Will transfuse 1 unit PRBC prior to discharge

## 2023-02-02 ENCOUNTER — Encounter (HOSPITAL_COMMUNITY)
Admission: EM | Disposition: A | Discharge status: Home / Self Care | Payer: Self-pay | Source: Home / Self Care | Attending: Internal Medicine

## 2023-02-02 ENCOUNTER — Inpatient Hospital Stay (HOSPITAL_COMMUNITY): Payer: Medicare Other | Admitting: Certified Registered"

## 2023-02-02 ENCOUNTER — Encounter (HOSPITAL_COMMUNITY): Payer: Self-pay | Admitting: Internal Medicine

## 2023-02-02 DIAGNOSIS — Z7984 Long term (current) use of oral hypoglycemic drugs: Secondary | ICD-10-CM

## 2023-02-02 DIAGNOSIS — K269 Duodenal ulcer, unspecified as acute or chronic, without hemorrhage or perforation: Secondary | ICD-10-CM

## 2023-02-02 DIAGNOSIS — Z87891 Personal history of nicotine dependence: Secondary | ICD-10-CM

## 2023-02-02 DIAGNOSIS — D649 Anemia, unspecified: Secondary | ICD-10-CM | POA: Diagnosis not present

## 2023-02-02 DIAGNOSIS — Z794 Long term (current) use of insulin: Secondary | ICD-10-CM

## 2023-02-02 DIAGNOSIS — E039 Hypothyroidism, unspecified: Secondary | ICD-10-CM

## 2023-02-02 DIAGNOSIS — K922 Gastrointestinal hemorrhage, unspecified: Secondary | ICD-10-CM | POA: Diagnosis not present

## 2023-02-02 DIAGNOSIS — I10 Essential (primary) hypertension: Secondary | ICD-10-CM

## 2023-02-02 DIAGNOSIS — E119 Type 2 diabetes mellitus without complications: Secondary | ICD-10-CM

## 2023-02-02 HISTORY — PX: BIOPSY: SHX5522

## 2023-02-02 HISTORY — PX: ESOPHAGOGASTRODUODENOSCOPY (EGD) WITH PROPOFOL: SHX5813

## 2023-02-02 LAB — BASIC METABOLIC PANEL
Anion gap: 7 (ref 5–15)
BUN: 5 mg/dL — ABNORMAL LOW (ref 8–23)
CO2: 26 mmol/L (ref 22–32)
Calcium: 8.5 mg/dL — ABNORMAL LOW (ref 8.9–10.3)
Chloride: 107 mmol/L (ref 98–111)
Creatinine, Ser: 0.68 mg/dL (ref 0.61–1.24)
GFR, Estimated: >60 mL/min (ref >60)
Glucose, Bld: 103 mg/dL — ABNORMAL HIGH (ref 70–99)
Potassium: 4.1 mmol/L (ref 3.5–5.1)
Sodium: 140 mmol/L (ref 135–145)

## 2023-02-02 LAB — CBC WITH DIFFERENTIAL/PLATELET
Abs Immature Granulocytes: 0.05 10*3/uL (ref 0.00–0.07)
Basophils Absolute: 0 10*3/uL (ref 0.0–0.1)
Basophils Relative: 1 %
Eosinophils Absolute: 0.2 10*3/uL (ref 0.0–0.5)
Eosinophils Relative: 4 %
HCT: 22.3 % — ABNORMAL LOW (ref 39.0–52.0)
Hemoglobin: 7.4 g/dL — ABNORMAL LOW (ref 13.0–17.0)
Immature Granulocytes: 1 %
Lymphocytes Relative: 40 %
Lymphs Abs: 1.8 10*3/uL (ref 0.7–4.0)
MCH: 31.8 pg (ref 26.0–34.0)
MCHC: 33.2 g/dL (ref 30.0–36.0)
MCV: 95.7 fL (ref 80.0–100.0)
Monocytes Absolute: 0.6 10*3/uL (ref 0.1–1.0)
Monocytes Relative: 12 %
Neutro Abs: 1.9 10*3/uL (ref 1.7–7.7)
Neutrophils Relative %: 42 %
Platelets: 299 10*3/uL (ref 150–400)
RBC: 2.33 MIL/uL — ABNORMAL LOW (ref 4.22–5.81)
RDW: 14.6 % (ref 11.5–15.5)
WBC: 4.5 10*3/uL (ref 4.0–10.5)
nRBC: 0 % (ref 0.0–0.2)

## 2023-02-02 LAB — HEMOGLOBIN AND HEMATOCRIT, BLOOD
HCT: 26 % — ABNORMAL LOW (ref 39.0–52.0)
Hemoglobin: 8.5 g/dL — ABNORMAL LOW (ref 13.0–17.0)

## 2023-02-02 LAB — GLUCOSE, CAPILLARY
Glucose-Capillary: 157 mg/dL — ABNORMAL HIGH (ref 70–99)
Glucose-Capillary: 134 mg/dL — ABNORMAL HIGH (ref 70–99)
Glucose-Capillary: 162 mg/dL — ABNORMAL HIGH (ref 70–99)

## 2023-02-02 LAB — PREPARE RBC (CROSSMATCH)

## 2023-02-02 SURGERY — ESOPHAGOGASTRODUODENOSCOPY (EGD) WITH PROPOFOL
Anesthesia: Monitor Anesthesia Care

## 2023-02-02 MED ORDER — SODIUM CHLORIDE 0.9% IV SOLUTION: Freq: Once | INTRAVENOUS | Status: AC

## 2023-02-02 MED ORDER — PHENYLEPHRINE HCL-NACL 20-0.9 MG/250ML-% IV SOLN
INTRAVENOUS | Status: DC | PRN
  Administered 2023-02-02: 50 ug/min via INTRAVENOUS

## 2023-02-02 MED ORDER — LACTATED RINGERS IV SOLN: INTRAVENOUS | Status: DC | PRN

## 2023-02-02 MED ORDER — PANTOPRAZOLE SODIUM 40 MG PO TBEC: 40.0000 mg | DELAYED_RELEASE_TABLET | Freq: Two times a day (BID) | ORAL | Status: DC

## 2023-02-02 MED ORDER — PANTOPRAZOLE SODIUM 40 MG PO TBEC: DELAYED_RELEASE_TABLET | ORAL | 3 refills | Status: DC

## 2023-02-02 MED ORDER — LIDOCAINE 2% (20 MG/ML) 5 ML SYRINGE
INTRAMUSCULAR | Status: DC | PRN
  Administered 2023-02-02: 100 mg via INTRAVENOUS

## 2023-02-02 MED ORDER — PROPOFOL 500 MG/50ML IV EMUL
INTRAVENOUS | Status: DC | PRN
  Administered 2023-02-02: 150 ug/kg/min via INTRAVENOUS

## 2023-02-02 SURGICAL SUPPLY — 15 items

## 2023-02-02 NOTE — Discharge Summary (Signed)
Physician Discharge Summary   Dean Zimmerman SJG:[id number] DOB: May 15, 1951 DOA: 01/31/2023  PCP: Glenis Smoker, MD  Admit date: 01/31/2023 Discharge date: 02/02/2023  Admitted From: Home Disposition:  Home Discharging physician: Dwyane Dee, MD Barriers to discharge: none  Recommendations for Outpatient Follow-up:  Discuss possibly resuming metformin and ozempic Follow up with GI regarding outpatient colonoscopy   Discharge Condition: stable CODE STATUS: Full Diet recommendation:  Diet Orders (From admission, onward)     Start     Ordered   02/02/23 1136  DIET SOFT Room service appropriate? Yes; Fluid consistency: Thin  Diet effective now       Question Answer Comment  Room service appropriate? Yes   Fluid consistency: Thin      02/02/23 1135            Hospital Course: Dean Zimmerman is a 72 yo male with PMH HTN, HLD, DM II, arthritis who presented with weakness and lethargy.  He had been having intermittent cramping abdominal pain in his lower abdomen with associated vomiting and rotating constipation/diarrhea.  He was seen in the ER on 01/27/2023 and underwent hydration and discontinuation of laxatives.  He was also seen by endocrinology on 01/29/2023 and his metformin and Ozempic were discontinued in setting of his abdominal symptoms. He then followed up outpatient with PCP and underwent blood work and was found to have significant anemia with hemoglobin down to 7.8 g/dL. He endorsed being on ibuprofen recently at home due to underlying right knee arthritis.  Also on admission he was endorsing black tarry stools. On admission, hemoglobin 8.5 g/dL which down trended to 7.3 g/dL. GI was consulted for further assistance. EGD was performed on 02/02/2023 which showed grade A reflux esophagitis with no bleeding.  Nonbleeding duodenal ulcer with no stigmata of bleeding found and biopsied.  He was recommended to continue on twice daily Protonix for 1 month then daily. Due to  borderline hemoglobin (7.4 g/dL) and symptomatic anemia, he was transfused 1 unit PRBC prior to discharge.  Assessment and Plan: * Symptomatic anemia - Suspected upper GIB in setting of NSAID use.  No other obvious risk factors at this time - GI following, underwent EGD on 02/02/2023 - Continue Protonix -Hemoglobin 7.4 g/dL on 02/02/2023.  Will transfuse 1 unit PRBC prior to discharge  GI bleed-resolved as of 02/02/2023 - See symptomatic anemia -Patient does not seem to have any significant cardiovascular disease and likely lower benefit from ongoing aspirin use at this time given findings on EGD.  Aspirin discontinued at discharge  Duodenal ulcer - EGD 02/02/23: Moderate post ulcer deformity characterized by erythema and nodularity edema found in first and second portion of duodenum.  1 nonbleeding cratered duodenal ulcer with no stigmata of bleeding found in the second portion of duodenum -Patient continued on Protonix twice daily at discharge for 1 month then daily - Patient understands recommendations for no further NSAID use  DMII (diabetes mellitus, type 2) (HCC) - A1c 5.5% on 01/31/2023 - Recently taken off of metformin and Ozempic by endocrinology due to his abdominal complaints which now may be instead due to underlying PUD -Patient to discuss possibly resuming metformin and Ozempic at follow-up  Hypothyroidism - Continue Synthroid  Essential hypertension - Home regimen resumed  Hyperlipidemia - Continue Lipitor  Arthritis - Patient has been using increased amounts of ibuprofen at home prior to admission which may be accounting for anemia  GERD (gastroesophageal reflux disease) - Continue Protonix  Hypocalcemia - Replete as needed   The  patient's chronic medical conditions were treated accordingly per the patient's home medication regimen except as noted.  On day of discharge, patient was felt deemed stable for discharge. Patient/family member advised to call PCP or come back  to ER if needed.   Principal Diagnosis: Symptomatic anemia  Discharge Diagnoses: Active Hospital Problems   Diagnosis Date Noted   Symptomatic anemia 01/31/2023    Priority: 1.   Duodenal ulcer 02/02/2023    Priority: 2.   DMII (diabetes mellitus, type 2) ([place]) 01/31/2023    Priority: 3.   Hypothyroidism 01/31/2023    Priority: 4.   Essential hypertension 01/31/2023    Priority: 5.   Hyperlipidemia 01/31/2023    Priority: 6.   Arthritis 01/31/2023    Priority: 7.   GERD (gastroesophageal reflux disease) 01/31/2023    Priority: 8.   Hypocalcemia 01/31/2023    Resolved Hospital Problems   Diagnosis Date Noted Date Resolved   GI bleed 01/31/2023 02/02/2023    Priority: 1.     Discharge Instructions     Increase activity slowly   Complete by: As directed       Allergies as of 02/02/2023       Reactions   Hydrocodone Hives   Morphine Itching, Swelling   Oxycontin [oxycodone Hcl] Hives        Medication List     STOP taking these medications    aspirin 81 MG tablet   dicyclomine 10 MG capsule Commonly known as: Bentyl   metFORMIN 500 MG 24 hr tablet Commonly known as: GLUCOPHAGE-XR   metoCLOPramide 10 MG tablet Commonly known as: Reglan   OMEPRAZOLE PO   ondansetron 8 MG disintegrating tablet Commonly known as: ZOFRAN-ODT   Ozempic (1 MG/DOSE) 4 MG/3ML Sopn Generic drug: Semaglutide (1 MG/DOSE)       TAKE these medications    atorvastatin 20 MG tablet Commonly known as: LIPITOR Take 20 mg by mouth daily.   enalapril 10 MG tablet Commonly known as: VASOTEC Take 10 mg by mouth daily.   escitalopram 10 MG tablet Commonly known as: LEXAPRO Take 10 mg by mouth daily.   Farxiga 5 MG Tabs tablet Generic drug: dapagliflozin propanediol Take 5 mg by mouth daily.   insulin glargine 100 UNIT/ML injection Commonly known as: LANTUS Inject 15 Units into the skin daily.   Latanoprost 0.005 % Emul Place 1 drop into both eyes at bedtime.    levothyroxine 150 MCG tablet Commonly known as: SYNTHROID Take 150 mcg by mouth daily before breakfast.   pantoprazole 40 MG tablet Commonly known as: PROTONIX 1 tablet twice a day for 1 month, then 1 tablet daily   Vitamin D 125 MCG (5000 UT) Caps Take 500 Units by mouth daily.        Allergies  Allergen Reactions   Hydrocodone Hives   Morphine Itching and Swelling   Oxycontin [Oxycodone Hcl] Hives    Consultations: GI  Procedures: 02/02/23: EGD  Discharge Exam: BP (!) 103/53 (BP Location: Right Arm)   Pulse 76   Temp 98.2 F (36.8 C) (Oral)   Resp 16   Ht '5\' 10"'$  (1.778 m)   Wt 99.8 kg   SpO2 100%   BMI 31.57 kg/m  Physical Exam Constitutional:      General: He is not in acute distress.    Appearance: Normal appearance.  HENT:     Head: Normocephalic and atraumatic.     Mouth/Throat:     Mouth: Mucous membranes are moist.  Eyes:  Extraocular Movements: Extraocular movements intact.  Cardiovascular:     Rate and Rhythm: Normal rate and regular rhythm.     Heart sounds: Normal heart sounds.  Pulmonary:     Effort: Pulmonary effort is normal. No respiratory distress.     Breath sounds: Normal breath sounds. No wheezing.  Abdominal:     General: Bowel sounds are normal. There is no distension.     Palpations: Abdomen is soft.     Tenderness: There is no abdominal tenderness.  Musculoskeletal:        General: Normal range of motion.     Cervical back: Normal range of motion and neck supple.  Skin:    General: Skin is warm and dry.  Neurological:     General: No focal deficit present.     Mental Status: He is alert.  Psychiatric:        Mood and Affect: Mood normal.        Behavior: Behavior normal.      The results of significant diagnostics from this hospitalization (including imaging, microbiology, ancillary and laboratory) are listed below for reference.   Microbiology: No results found for this or any previous visit (from the past 240  hour(s)).   Labs: BNP (last 3 results) No results for input(s): "BNP" in the last 8760 hours. Basic Metabolic Panel: Recent Labs  Lab 01/27/23 2318 01/31/23 1137 02/02/23 0227  NA 140 136 140  K 3.7 3.8 4.1  CL 101 104 107  CO2 '24 23 26  '$ GLUCOSE 308* 158* 103*  BUN 32* 9 5*  CREATININE 0.80 0.86 0.68  CALCIUM 9.5 8.5* 8.5*   Liver Function Tests: Recent Labs  Lab 01/27/23 2318 01/31/23 1137  AST 29 39  ALT 43 50*  ALKPHOS 65 56  BILITOT 0.7 0.4  PROT 6.7 5.7*  ALBUMIN 4.0 3.5   Recent Labs  Lab 01/27/23 2318 01/31/23 1137  LIPASE 66* 51   No results for input(s): "AMMONIA" in the last 168 hours. CBC: Recent Labs  Lab 01/27/23 2318 01/31/23 1137 01/31/23 1818 02/01/23 0135 02/01/23 1241 02/02/23 0227  WBC 17.7* 9.2  --  5.9  --  4.5  NEUTROABS 14.8* 5.7  --   --   --  1.9  HGB 10.7* 8.2* 8.5* 7.3* 8.2* 7.4*  HCT 29.9* 24.8* 24.7* 21.0* 24.5* 22.3*  MCV 90.9 96.9  --  92.9  --  95.7  PLT 367 321  --  276  --  299   Cardiac Enzymes: No results for input(s): "CKTOTAL", "CKMB", "CKMBINDEX", "TROPONINI" in the last 168 hours. BNP: Invalid input(s): "POCBNP" CBG: Recent Labs  Lab 02/01/23 1656 02/01/23 2212 02/02/23 0800 02/02/23 1055 02/02/23 1222  GLUCAP 103* 117* 157* 134* 162*   D-Dimer No results for input(s): "DDIMER" in the last 72 hours. Hgb A1c Recent Labs    01/31/23 1818  HGBA1C 5.5   Lipid Profile No results for input(s): "CHOL", "HDL", "LDLCALC", "TRIG", "CHOLHDL", "LDLDIRECT" in the last 72 hours. Thyroid function studies No results for input(s): "TSH", "T4TOTAL", "T3FREE", "THYROIDAB" in the last 72 hours.  Invalid input(s): "FREET3" Anemia work up No results for input(s): "VITAMINB12", "FOLATE", "FERRITIN", "TIBC", "IRON", "RETICCTPCT" in the last 72 hours. Urinalysis    Component Value Date/Time   COLORURINE YELLOW 01/27/2023 2318   APPEARANCEUR CLEAR 01/27/2023 2318   LABSPEC 1.034 (H) 01/27/2023 2318   PHURINE 6.5  01/27/2023 2318   GLUCOSEU >1,000 (A) 01/27/2023 2318   HGBUR NEGATIVE 01/27/2023 2318   BILIRUBINUR  NEGATIVE 01/27/2023 2318   KETONESUR 40 (A) 01/27/2023 2318   PROTEINUR NEGATIVE 01/27/2023 2318   NITRITE NEGATIVE 01/27/2023 2318   LEUKOCYTESUR NEGATIVE 01/27/2023 2318   Sepsis Labs Recent Labs  Lab 01/27/23 2318 01/31/23 1137 02/01/23 0135 02/02/23 0227  WBC 17.7* 9.2 5.9 4.5   Microbiology No results found for this or any previous visit (from the past 240 hour(s)).  Procedures/Studies: CT ABDOMEN PELVIS W CONTRAST  Result Date: 01/31/2023 CLINICAL DATA:  Acute abdominal pain and weight loss. EXAM: CT ABDOMEN AND PELVIS WITH CONTRAST TECHNIQUE: Multidetector CT imaging of the abdomen and pelvis was performed using the standard protocol following bolus administration of intravenous contrast. RADIATION DOSE REDUCTION: This exam was performed according to the departmental dose-optimization program which includes automated exposure control, adjustment of the mA and/or kV according to patient size and/or use of iterative reconstruction technique. CONTRAST:  47m OMNIPAQUE IOHEXOL 350 MG/ML SOLN COMPARISON:  Abdominal ultrasound 01/02/2023. CT abdomen and pelvis 05/30/2021. FINDINGS: Lower chest: No acute abnormality. Hepatobiliary: No focal liver abnormality is seen. No gallstones, gallbladder wall thickening, or biliary dilatation. Pancreas: Unremarkable. No pancreatic ductal dilatation or surrounding inflammatory changes. Spleen: Normal in size without focal abnormality. Adrenals/Urinary Tract: Left peripelvic cysts present measuring 18 mm, unchanged. Otherwise, the kidneys, adrenal glands, and bladder are within normal limits. Stomach/Bowel: Stomach is within normal limits. Appendix is not seen. There is no bowel obstruction. There is inflammatory stranding and wall thickening of the second portion of the duodenal. On the coronal view there is a small focus of air image 6/36 lateral to the  second portion of the duodenal. This may be related to a small diverticulum or micro perforation. No drainable fluid collection identified. Stomach appears within normal limits. Vascular/Lymphatic: Aortic atherosclerosis. No enlarged abdominal or pelvic lymph nodes. Reproductive: Prostate gland appears within normal limits. Other: Wide-mouth small ventral hernia present right of the umbilicus. No ascites. Musculoskeletal: No acute or significant osseous findings. IMPRESSION: 1. Inflammatory stranding and wall thickening of the second portion of the duodenal wall worrisome for duodenitis. There is a small focus of air lateral to the second portion of the duodenal wall which may be related to a small diverticulum or micro perforation. No drainable fluid collection. Aortic Atherosclerosis (ICD10-I70.0). Electronically Signed   By: ARonney AstersM.D.   On: 01/31/2023 22:12     Time coordinating discharge: Over 30 minutes    DDwyane Dee MD  [facility] 02/02/2023, 2:19 PM

## 2023-02-02 NOTE — Discharge Instructions (Signed)
Your abdominal pain symptoms may have been due to underlying peptic ulcer disease.  Discuss with your outpatient prescriber about possibly resuming metformin and Ozempic.

## 2023-02-02 NOTE — Assessment & Plan Note (Signed)
-   EGD 02/02/23: Moderate post ulcer deformity characterized by erythema and nodularity edema found in first and second portion of duodenum.  1 nonbleeding cratered duodenal ulcer with no stigmata of bleeding found in the second portion of duodenum -Patient continued on Protonix twice daily at discharge for 1 month then daily - Patient understands recommendations for no further NSAID use

## 2023-02-02 NOTE — Progress Notes (Signed)
Pt. Ready for d/c after 1u PRBC transfusion is done, with H & H lab draw after.

## 2023-02-02 NOTE — Anesthesia Preprocedure Evaluation (Addendum)
Anesthesia Evaluation  Patient identified by MRN, date of birth, ID band Patient awake    Reviewed: Allergy & Precautions, NPO status , Patient's Chart, lab work & pertinent test results  Airway Mallampati: II  TM Distance: >3 FB Neck ROM: Full    Dental  (+) Teeth Intact, Dental Advisory Given   Pulmonary sleep apnea , former smoker   breath sounds clear to auscultation       Cardiovascular hypertension, Pt. on medications  Rhythm:Regular Rate:Normal     Neuro/Psych negative neurological ROS  negative psych ROS   GI/Hepatic Neg liver ROS,GERD  Medicated,,  Endo/Other  diabetes, Type 2, Insulin Dependent, Oral Hypoglycemic AgentsHypothyroidism    Renal/GU negative Renal ROS     Musculoskeletal  (+) Arthritis ,    Abdominal   Peds  Hematology   Anesthesia Other Findings   Reproductive/Obstetrics                             Anesthesia Physical Anesthesia Plan  ASA: 3  Anesthesia Plan: MAC   Post-op Pain Management: Minimal or no pain anticipated   Induction: Intravenous  PONV Risk Score and Plan: 0 and Propofol infusion  Airway Management Planned: Natural Airway and Nasal Cannula  Additional Equipment: None  Intra-op Plan:   Post-operative Plan:   Informed Consent: I have reviewed the patients History and Physical, chart, labs and discussed the procedure including the risks, benefits and alternatives for the proposed anesthesia with the patient or authorized representative who has indicated his/her understanding and acceptance.       Plan Discussed with: CRNA  Anesthesia Plan Comments:         Anesthesia Quick Evaluation

## 2023-02-02 NOTE — Plan of Care (Signed)

## 2023-02-02 NOTE — Progress Notes (Signed)
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

## 2023-02-02 NOTE — Op Note (Signed)
[facility] Patient Name: Dean Zimmerman Procedure Date : 02/02/2023 MRN: [id number] Attending MD: Clarene Essex , MD, [id number] Date of Birth: June 18, 1951 CSN: [id number] Age: 72 Admit Type: Inpatient Procedure:                Upper GI endoscopy Indications:              Iron deficiency anemia secondary to chronic blood                            loss, Abnormal CT of the GI tract Providers:                Clarene Essex, MD, Jeanella Cara, RN, Darliss Cheney, Technician, Jenny Reichmann, CRNA Referring MD:              Medicines:                Monitored Anesthesia Care Complications:            No immediate complications. Estimated Blood Loss:     Estimated blood loss: none. Procedure:                Pre-Anesthesia Assessment:                           - Prior to the procedure, a History and Physical                            was performed, and patient medications and                            allergies were reviewed. The patient's tolerance of                            previous anesthesia was also reviewed. The risks                            and benefits of the procedure and the sedation                            options and risks were discussed with the patient.                            All questions were answered, and informed consent                            was obtained. Prior Anticoagulants: The patient has                            taken no anticoagulant or antiplatelet agents                            except for aspirin. ASA Grade Assessment: II - A  patient with mild systemic disease. After reviewing                            the risks and benefits, the patient was deemed in                            satisfactory condition to undergo the procedure.                           After obtaining informed consent, the endoscope was                            passed under direct vision. Throughout the                             procedure, the patient's blood pressure, pulse, and                            oxygen saturations were monitored continuously. The                            [id number]-[id number] ([id number]) Olympus endoscope was introduced                            through the mouth, and advanced to the third part                            of duodenum. The upper GI endoscopy was                            accomplished without difficulty. The patient                            tolerated the procedure well. Scope In: Scope Out: Findings:      The larynx was normal.      A small hiatal hernia was present.      LA Grade A (one or more mucosal breaks less than 5 mm, not extending       between tops of 2 mucosal folds) esophagitis with no bleeding was found.      The entire examined stomach was normal. Biopsies were taken with a cold       forceps for histology.      A moderate post-ulcer deformity characterized by erythema and nodularity       edema was found in the first portion of the duodenum and in the second       portion of the duodenum. Biopsies were taken with a cold forceps for       histology.      One non-bleeding cratered duodenal ulcer with no stigmata of bleeding       was found in the second portion of the duodenum. Biopsies were taken       with a cold forceps for histology of the edge which had some increased       deformity nodularity erythema as above.      The proximal duodenal bulb and third portion of the  duodenum were normal.      The exam was otherwise without abnormality. Impression:               - Normal larynx.                           - Small hiatal hernia.                           - LA Grade A reflux esophagitis with no bleeding.                           - Normal stomach. Biopsied.                           - Duodenal deformity. Biopsied.                           - Non-bleeding duodenal ulcer with no stigmata of                            bleeding. Biopsied.                            - Normal duodenal bulb and third portion of the                            duodenum.                           - The examination was otherwise normal. Recommendation:           - Soft diet today. No further GI workup but please                            call us if we could be of any further assistance                            with this hospital stay and hopefully will be able                            to go home soon                           - No aspirin, ibuprofen, naproxen, or other                            non-steroidal anti-inflammatory drugs Long-term. If                            absolutely needs aspirin may restart in 2 weeks                            using a baby only and recommend Tylenol only going  forward                           - Await pathology results.                           - Return to GI clinic in 4 weeks and would probably                            set up repeat endoscopy if needed to document                            healing in a few months with screening colonoscopy                            just to be sure.                           - Telephone GI clinic for pathology results in 1                            week.                           - Telephone GI clinic if symptomatic PRN.                           - Use Protonix (pantoprazole) 40 mg PO BID for 1                            month then once a day long-term. Procedure Code(s):        --- Professional ---                           [phone], Esophagogastroduodenoscopy, flexible,                            transoral; with biopsy, single or multiple Diagnosis Code(s):        --- Professional ---                           K44.9, Diaphragmatic hernia without obstruction or                            gangrene                           K21.00, Gastro-esophageal reflux disease with                            esophagitis, without bleeding                            K31.89, Other diseases of stomach and duodenum  K26.9, Duodenal ulcer, unspecified as acute or                            chronic, without hemorrhage or perforation                           D50.0, Iron deficiency anemia secondary to blood                            loss (chronic)                           R93.3, Abnormal findings on diagnostic imaging of                            other parts of digestive tract CPT copyright 2022 [employer]. All rights reserved. The codes documented in this report are preliminary and upon coder review may  be revised to meet current compliance requirements. Clarene Essex, MD 02/02/2023 10:56:54 AM This report has been signed electronically. Number of Addenda: 0

## 2023-02-03 LAB — OCCULT BLOOD, POC DEVICE: Fecal Occult Bld: POSITIVE — AB

## 2023-02-03 LAB — BPAM RBC
Blood Product Expiration Date: 202403082359
ISSUE DATE / TIME: 202402041309
Unit Type and Rh: 5100

## 2023-02-03 LAB — TYPE AND SCREEN
ABO/RH(D): O POS
Antibody Screen: NEGATIVE
Unit division: 0

## 2023-02-04 LAB — SURGICAL PATHOLOGY

## 2023-02-04 NOTE — Anesthesia Postprocedure Evaluation (Signed)
Anesthesia Post Note  Patient: Dean Zimmerman  Procedure(s) Performed: ESOPHAGOGASTRODUODENOSCOPY (EGD) WITH PROPOFOL BIOPSY     Patient location during evaluation: PACU Anesthesia Type: MAC Level of consciousness: awake and alert Pain management: pain level controlled Vital Signs Assessment: post-procedure vital signs reviewed and stable Respiratory status: spontaneous breathing, nonlabored ventilation, respiratory function stable and patient connected to nasal cannula oxygen Cardiovascular status: stable and blood pressure returned to baseline Postop Assessment: no apparent nausea or vomiting Anesthetic complications: no   No notable events documented.            Effie Berkshire

## 2023-02-05 ENCOUNTER — Encounter (HOSPITAL_COMMUNITY): Payer: Self-pay | Admitting: Gastroenterology

## 2023-02-05 NOTE — Transfer of Care (Signed)
Immediate Anesthesia Transfer of Care Note  Patient: Dean Zimmerman  Procedure(s) Performed: ESOPHAGOGASTRODUODENOSCOPY (EGD) WITH PROPOFOL BIOPSY  Patient Location: PACU  Anesthesia Type:MAC  Level of Consciousness: drowsy and patient cooperative  Airway & Oxygen Therapy: Patient Spontanous Breathing  Post-op Assessment: Report given to RN and Post -op Vital signs reviewed and stable  Post vital signs: Reviewed and stable  Last Vitals:  Vitals Value Taken Time  BP 111/60 02/02/23 1610  Temp 36.7 C 02/02/23 1610  Pulse 79 02/02/23 1610  Resp 17 02/02/23 1610  SpO2 100 % 02/02/23 1610    Last Pain:  Vitals:   02/02/23 1610  TempSrc: Oral  PainSc:       Patients Stated Pain Goal: 0 (03/00/92 3300)  Complications: No notable events documented.

## 2023-02-10 ENCOUNTER — Other Ambulatory Visit: Payer: Self-pay | Admitting: Orthopaedic Surgery

## 2023-02-28 NOTE — Progress Notes (Signed)
Type and screen needs to be drawn DOS. Pt had blood transfusion in the last 90 days  COVID Vaccine Completed: yes  Date of COVID positive in last 90 days: no  PCP - Sela Hilding, MD Cardiologist - n/a  Chest x-ray - 03/03/23 Epic EKG - 03/03/23 Epic/chart Stress Test - n/a ECHO - n/a Cardiac Cath - n/a Pacemaker/ICD device last checked: n/a Spinal Cord Stimulator: n/a  Bowel Prep - no  Sleep Study - yes CPAP - yes every night, instructed to bring mask and tubing  Fasting Blood Sugar - 120s Checks Blood Sugar  freestyle libre  Last dose of GLP1 agonist-  Ozempic GLP1 instructions:  hold 03/08/23, last dose 03/01/23   Last dose of SGLT-2 inhibitors-  N/A SGLT-2 instructions: N/A   Blood Thinner Instructions: Aspirin Instructions: ASA 81, holding until after surgery Last Dose:  Activity level: Can go up a flight of stairs and perform activities of daily living without stopping and without symptoms of chest pain or shortness of breath.    Anesthesia review: hospitalized for anemia 01/31/23, HTN, DM2, OSA  Patient denies shortness of breath, fever, cough and chest pain at PAT appointment  Patient verbalized understanding of instructions that were given to them at the PAT appointment. Patient was also instructed that they will need to review over the PAT instructions again at home before surgery.

## 2023-03-03 ENCOUNTER — Encounter (HOSPITAL_COMMUNITY)
Admission: RE | Admit: 2023-03-03 | Discharge: 2023-03-03 | Disposition: A | Discharge status: Home / Self Care | Payer: Medicare Other | Source: Ambulatory Visit | Attending: Orthopaedic Surgery | Admitting: Orthopaedic Surgery

## 2023-03-03 ENCOUNTER — Ambulatory Visit (HOSPITAL_COMMUNITY)
Admission: RE | Admit: 2023-03-03 | Discharge: 2023-03-03 | Disposition: A | Discharge status: Home / Self Care | Payer: Medicare Other | Source: Ambulatory Visit | Attending: Orthopaedic Surgery | Admitting: Orthopaedic Surgery

## 2023-03-03 ENCOUNTER — Encounter (HOSPITAL_COMMUNITY): Payer: Self-pay

## 2023-03-03 VITALS — BP 118/69 | HR 67 | Temp 98.1°F | Resp 16 | Ht 70.0 in | Wt 225.0 lb

## 2023-03-03 DIAGNOSIS — E1169 Type 2 diabetes mellitus with other specified complication: Secondary | ICD-10-CM | POA: Insufficient documentation

## 2023-03-03 DIAGNOSIS — Z794 Long term (current) use of insulin: Secondary | ICD-10-CM | POA: Diagnosis not present

## 2023-03-03 DIAGNOSIS — I10 Essential (primary) hypertension: Secondary | ICD-10-CM | POA: Diagnosis not present

## 2023-03-03 DIAGNOSIS — Z01818 Encounter for other preprocedural examination: Secondary | ICD-10-CM | POA: Insufficient documentation

## 2023-03-03 HISTORY — DX: Personal history of urinary calculi: Z87.442

## 2023-03-03 HISTORY — DX: Malignant (primary) neoplasm, unspecified: C80.1

## 2023-03-03 HISTORY — DX: Unspecified osteoarthritis, unspecified site: M19.90

## 2023-03-03 HISTORY — DX: Hypothyroidism, unspecified: E03.9

## 2023-03-03 LAB — BASIC METABOLIC PANEL
Anion gap: 10 (ref 5–15)
BUN: 21 mg/dL (ref 8–23)
CO2: 24 mmol/L (ref 22–32)
Calcium: 9.3 mg/dL (ref 8.9–10.3)
Chloride: 105 mmol/L (ref 98–111)
Creatinine, Ser: 0.87 mg/dL (ref 0.61–1.24)
GFR, Estimated: >60 mL/min (ref >60)
Glucose, Bld: 103 mg/dL — ABNORMAL HIGH (ref 70–99)
Potassium: 4 mmol/L (ref 3.5–5.1)
Sodium: 139 mmol/L (ref 135–145)

## 2023-03-03 LAB — CBC
HCT: 38 % — ABNORMAL LOW (ref 39.0–52.0)
Hemoglobin: 12 g/dL — ABNORMAL LOW (ref 13.0–17.0)
MCH: 29.4 pg (ref 26.0–34.0)
MCHC: 31.6 g/dL (ref 30.0–36.0)
MCV: 93.1 fL (ref 80.0–100.0)
Platelets: 237 10*3/uL (ref 150–400)
RBC: 4.08 MIL/uL — ABNORMAL LOW (ref 4.22–5.81)
RDW: 13.1 % (ref 11.5–15.5)
WBC: 5.6 10*3/uL (ref 4.0–10.5)
nRBC: 0 % (ref 0.0–0.2)

## 2023-03-03 LAB — GLUCOSE, CAPILLARY: Glucose-Capillary: 103 mg/dL — ABNORMAL HIGH (ref 70–99)

## 2023-03-03 LAB — SURGICAL PCR SCREEN
MRSA, PCR: NEGATIVE
Staphylococcus aureus: NEGATIVE

## 2023-03-03 NOTE — Patient Instructions (Addendum)
SURGICAL WAITING ROOM VISITATION  Patients having surgery or a procedure may have no more than 2 support people in the waiting area - these visitors may rotate.    Children under the age of 47 must have an adult with them who is not the patient.  Due to an increase in RSV and influenza rates and associated hospitalizations, children ages 52 and under may not visit patients in [place].  If the patient needs to stay at the hospital during part of their recovery, the visitor guidelines for inpatient rooms apply. Pre-op nurse will coordinate an appropriate time for 1 support person to accompany patient in pre-op.  This support person may not rotate.    Please refer to the [facility] website for the visitor guidelines for Inpatients (after your surgery is over and you are in a regular room).    Your procedure is scheduled on: 03/11/23   Report to [facility] Main Entrance    Report to admitting at 7:20 AM   Call this number if you have problems the morning of surgery [phone]   Do not eat food :After Midnight.   After Midnight you may have the following liquids until 6:50 AM DAY OF SURGERY  Water Non-Citrus Juices (without pulp, NO RED-Apple, White grape, White cranberry) Black Coffee (NO MILK/CREAM OR CREAMERS, sugar ok)  Clear Tea (NO MILK/CREAM OR CREAMERS, sugar ok) regular and decaf                             Plain Jell-O (NO RED)                                           Fruit ices (not with fruit pulp, NO RED)                                     Popsicles (NO RED)                                                               Sports drinks like Gatorade (NO RED     The day of surgery:  Drink ONE (1) Pre-Surgery G2 at 6:50 AM the morning of surgery. Drink in one sitting. Do not sip.  This drink was given to you during your hospital  pre-op appointment visit. Nothing else to drink after completing the  Pre-Surgery G2.          If you have questions,  please contact your surgeon's office.   FOLLOW BOWEL PREP AND ANY ADDITIONAL PRE OP INSTRUCTIONS YOU RECEIVED FROM YOUR SURGEON'S OFFICE!!!     Oral Hygiene is also important to reduce your risk of infection.                                    Remember - BRUSH YOUR TEETH THE MORNING OF SURGERY WITH YOUR REGULAR TOOTHPASTE  DENTURES WILL BE REMOVED PRIOR TO SURGERY PLEASE DO NOT APPLY "Poly grip" OR ADHESIVES!!!  Take these medicines the morning of surgery with A SIP OF WATER: Atorvastatin, Ecitalopram, Levothyroxine, Pantoprazole  DO NOT TAKE ANY ORAL DIABETIC MEDICATIONS DAY OF YOUR SURGERY  How to Manage Your Diabetes Before and After Surgery  Why is it important to control my blood sugar before and after surgery? Improving blood sugar levels before and after surgery helps healing and can limit problems. A way of improving blood sugar control is eating a healthy diet by:  Eating less sugar and carbohydrates  Increasing activity/exercise  Talking with your doctor about reaching your blood sugar goals High blood sugars (greater than 180 mg/dL) can raise your risk of infections and slow your recovery, so you will need to focus on controlling your diabetes during the weeks before surgery. Make sure that the doctor who takes care of your diabetes knows about your planned surgery including the date and location.  How do I manage my blood sugar before surgery? Check your blood sugar at least 4 times a day, starting 2 days before surgery, to make sure that the level is not too high or low. Check your blood sugar the morning of your surgery when you wake up and every 2 hours until you get to the Short Stay unit. If your blood sugar is less than 70 mg/dL, you will need to treat for low blood sugar: Do not take insulin. Treat a low blood sugar (less than 70 mg/dL) with  cup of clear juice (cranberry or apple), 4 glucose tablets, OR glucose gel. Recheck blood sugar in 15 minutes after  treatment (to make sure it is greater than 70 mg/dL). If your blood sugar is not greater than 70 mg/dL on recheck, call [phone] for further instructions. Report your blood sugar to the short stay nurse when you get to Short Stay.  If you are admitted to the hospital after surgery: Your blood sugar will be checked by the staff and you will probably be given insulin after surgery (instead of oral diabetes medicines) to make sure you have good blood sugar levels. The goal for blood sugar control after surgery is 80-180 mg/dL.   WHAT DO I DO ABOUT MY DIABETES MEDICATION?  Do not take oral diabetes medicines (pills) the morning of surgery.  Hold Farxiga 3 days before surgery. Last dose 03/07/23  Hold Ozempic 7 days prior to surgery. Do not take 03/08/23. May resume after surgery  THE DAY BEFORE SURGERY, take Lantus as prescribed. Take Humalog before meals as prescribed.    THE MORNING OF SURGERY, Take 50% of Lantus. Do not take Humalog unless blood sugar is greater than 220, then take 50%  DO NOT TAKE THE FOLLOWING 7 DAYS PRIOR TO SURGERY: Ozempic, Wegovy, Rybelsus (Semaglutide), Byetta (exenatide), Bydureon (exenatide ER), Victoza, Saxenda (liraglutide), or Trulicity (dulaglutide) Mounjaro (Tirzepatide) Adlyxin (Lixisenatide), Polyethylene Glycol Loxenatide.  If your CBG is greater than 220 mg/dL, you may take  of your sliding scale  (correction) dose of insulin.  Reviewed and Endorsed by [facility] Patient Education Committee, August 2015  Bring CPAP mask and tubing day of surgery.                              You may not have any metal on your body including jewelry, and body piercing             Do not wear lotions, powders, cologne, or deodorant  Do not shave  48 hours prior to surgery.  Men may shave face and neck.   Do not bring valuables to the hospital. [place].   Contacts, glasses, dentures or bridgework  may not be worn into surgery.  DO NOT [place]. PHARMACY WILL DISPENSE MEDICATIONS LISTED ON YOUR MEDICATION LIST TO YOU DURING YOUR ADMISSION [place]!    Patients discharged on the day of surgery will not be allowed to drive home.  Someone NEEDS to stay with you for the first 24 hours after anesthesia.   Special Instructions: Bring a copy of your healthcare power of attorney and living will documents the day of surgery if you haven't scanned them before.              Please read over the following fact sheets you were given: [place] [phone] Schneiders   If you received a COVID test during your pre-op visit  it is requested that you wear a mask when out in public, stay away from anyone that may not be feeling well and notify your surgeon if you develop symptoms. If you test positive for Covid or have been in contact with anyone that has tested positive in the last 10 days please notify you surgeon.    [employer] - Preparing for Surgery Before surgery, you can play an important role.  Because skin is not sterile, your skin needs to be as free of germs as possible.  You can reduce the number of germs on your skin by washing with CHG (chlorahexidine gluconate) soap before surgery.  CHG is an antiseptic cleaner which kills germs and bonds with the skin to continue killing germs even after washing. Please DO NOT use if you have an allergy to CHG or antibacterial soaps.  If your skin becomes reddened/irritated stop using the CHG and inform your nurse when you arrive at Short Stay. Do not shave (including legs and underarms) for at least 48 hours prior to the first CHG shower.  You may shave your face/neck.  Please follow these instructions carefully:  1.  Shower with CHG Soap the night before surgery and the  morning of surgery.  2.  If you choose to wash your hair, wash your hair first as usual with  your normal  shampoo.  3.  After you shampoo, rinse your hair and body thoroughly to remove the shampoo.                             4.  Use CHG as you would any other liquid soap.  You can apply chg directly to the skin and wash.  Gently with a scrungie or clean washcloth.  5.  Apply the CHG Soap to your body ONLY FROM THE NECK DOWN.   Do   not use on face/ open                           Wound or open sores. Avoid contact with eyes, ears mouth and   genitals (private parts).                       Wash face,  Genitals (private parts) with your normal soap.  6.  Wash thoroughly, paying special attention to the area where your    surgery  will be performed.  7.  Thoroughly rinse your body with warm water from the neck down.  8.  DO NOT shower/wash with your normal soap after using and rinsing off the CHG Soap.                9.  Pat yourself dry with a clean towel.            10.  Wear clean pajamas.            11.  Place clean sheets on your bed the night of your first shower and do not  sleep with pets. Day of Surgery : Do not apply any lotions/deodorants the morning of surgery.  Please wear clean clothes to the hospital/surgery center.  FAILURE TO FOLLOW THESE INSTRUCTIONS MAY RESULT IN THE CANCELLATION OF YOUR SURGERY  PATIENT SIGNATURE_________________________________  NURSE SIGNATURE__________________________________  ________________________________________________________________________  Adam Phenix  An incentive spirometer is a tool that can help keep your lungs clear and active. This tool measures how well you are filling your lungs with each breath. Taking long deep breaths may help reverse or decrease the chance of developing breathing (pulmonary) problems (especially infection) following: A long period of time when you are unable to move or be active. BEFORE THE PROCEDURE  If the spirometer includes an indicator to show your best effort, your nurse or  respiratory therapist will set it to a desired goal. If possible, sit up straight or lean slightly forward. Try not to slouch. Hold the incentive spirometer in an upright position. INSTRUCTIONS FOR USE  Sit on the edge of your bed if possible, or sit up as far as you can in bed or on a chair. Hold the incentive spirometer in an upright position. Breathe out normally. Place the mouthpiece in your mouth and seal your lips tightly around it. Breathe in slowly and as deeply as possible, raising the piston or the ball toward the top of the column. Hold your breath for 3-5 seconds or for as long as possible. Allow the piston or ball to fall to the bottom of the column. Remove the mouthpiece from your mouth and breathe out normally. Rest for a few seconds and repeat Steps 1 through 7 at least 10 times every 1-2 hours when you are awake. Take your time and take a few normal breaths between deep breaths. The spirometer may include an indicator to show your best effort. Use the indicator as a goal to work toward during each repetition. After each set of 10 deep breaths, practice coughing to be sure your lungs are clear. If you have an incision (the cut made at the time of surgery), support your incision when coughing by placing a pillow or rolled up towels firmly against it. Once you are able to get out of bed, walk around indoors and cough well. You may stop using the incentive spirometer when instructed by your caregiver.  RISKS AND COMPLICATIONS Take your time so you do not get dizzy or light-headed. If you are in pain, you may need to take or ask for pain medication before doing incentive spirometry. It is harder to take a deep breath if you are having pain. AFTER USE Rest and breathe slowly and easily. It can be helpful to keep track of a log of your progress. Your caregiver can provide you with a simple table to help with this. If you  are using the spirometer at home, follow these  instructions: [place] IF:  You are having difficultly using the spirometer. You have trouble using the spirometer as often as instructed. Your pain medication is not giving enough relief while using the spirometer. You develop fever of 100.5 F (38.1 C) or higher. SEEK IMMEDIATE MEDICAL CARE IF:  You cough up bloody sputum that had not been present before. You develop fever of 102 F (38.9 C) or greater. You develop worsening pain at or near the incision site. MAKE SURE YOU:  Understand these instructions. Will watch your condition. Will get help right away if you are not doing well or get worse. Document Released: 04/28/2007 Document Revised: 03/09/2012 Document Reviewed: 06/29/2007 Exit[employer] Patient Information 2014 [employer], [place].   ________________________________________________________________________ WHAT IS A BLOOD TRANSFUSION? Blood Transfusion Information  A transfusion is the replacement of blood or some of its parts. Blood is made up of multiple cells which provide different functions. Red blood cells carry oxygen and are used for blood loss replacement. White blood cells fight against infection. Platelets control bleeding. Plasma helps clot blood. Other blood products are available for specialized needs, such as hemophilia or other clotting disorders. BEFORE THE TRANSFUSION  Who gives blood for transfusions?  Healthy volunteers who are fully evaluated to make sure their blood is safe. This is blood bank blood. Transfusion therapy is the safest it has ever been in the practice of medicine. Before blood is taken from a donor, a complete history is taken to make sure that person has no history of diseases nor engages in risky social behavior (examples are intravenous drug use or sexual activity with multiple partners). The donor's travel history is screened to minimize risk of transmitting infections, such as malaria. The donated blood is tested for signs of  infectious diseases, such as HIV and hepatitis. The blood is then tested to be sure it is compatible with you in order to minimize the chance of a transfusion reaction. If you or a relative donates blood, this is often done in anticipation of surgery and is not appropriate for emergency situations. It takes many days to process the donated blood. RISKS AND COMPLICATIONS Although transfusion therapy is very safe and saves many lives, the main dangers of transfusion include:  Getting an infectious disease. Developing a transfusion reaction. This is an allergic reaction to something in the blood you were given. Every precaution is taken to prevent this. The decision to have a blood transfusion has been considered carefully by your caregiver before blood is given. Blood is not given unless the benefits outweigh the risks. AFTER THE TRANSFUSION Right after receiving a blood transfusion, you will usually feel much better and more energetic. This is especially true if your red blood cells have gotten low (anemic). The transfusion raises the level of the red blood cells which carry oxygen, and this usually causes an energy increase. The nurse administering the transfusion will monitor you carefully for complications. HOME CARE INSTRUCTIONS  No special instructions are needed after a transfusion. You may find your energy is better. Speak with your caregiver about any limitations on activity for underlying diseases you may have. SEEK MEDICAL CARE IF:  Your condition is not improving after your transfusion. You develop redness or irritation at the intravenous (IV) site. SEEK IMMEDIATE MEDICAL CARE IF:  Any of the following symptoms occur over the next 12 hours: Shaking chills. You have a temperature by mouth above 102 F (38.9 C), not controlled by  medicine. Chest, back, or muscle pain. People around you feel you are not acting correctly or are confused. Shortness of breath or difficulty  breathing. Dizziness and fainting. You get a rash or develop hives. You have a decrease in urine output. Your urine turns a dark color or changes to pink, red, or brown. Any of the following symptoms occur over the next 10 days: You have a temperature by mouth above 102 F (38.9 C), not controlled by medicine. Shortness of breath. Weakness after normal activity. The white part of the eye turns yellow (jaundice). You have a decrease in the amount of urine or are urinating less often. Your urine turns a dark color or changes to pink, red, or brown. Document Released: 12/13/2000 Document Revised: 03/09/2012 Document Reviewed: 08/01/2008 [facility] Patient Information 2014 [place], [place].  _______________________________________________________________________

## 2023-03-04 LAB — HEMOGLOBIN A1C
Hgb A1c MFr Bld: 5.3 % (ref 4.8–5.6)
Mean Plasma Glucose: 105 mg/dL

## 2023-03-07 NOTE — H&P (Signed)
TOTAL KNEE ADMISSION H&P  Patient is being admitted for right total knee arthroplasty.  Subjective:  Chief Complaint:right knee pain.  HPI: Dean Zimmerman, 72 y.o. male, has a history of pain and functional disability in the right knee due to arthritis and has failed non-surgical conservative treatments for greater than 12 weeks to includeNSAID's and/or analgesics, corticosteriod injections, flexibility and strengthening excercises, supervised PT with diminished ADL's post treatment, use of assistive devices, weight reduction as appropriate, and activity modification.  Onset of symptoms was gradual, starting 5 years ago with gradually worsening course since that time. The patient noted prior procedures on the knee to include  arthroscopy on the right knee(s).  Patient currently rates pain in the right knee(s) at 10 out of 10 with activity. Patient has night pain, worsening of pain with activity and weight bearing, pain that interferes with activities of daily living, crepitus, and joint swelling.  Patient has evidence of subchondral cysts, subchondral sclerosis, periarticular osteophytes, and joint space narrowing by imaging studies. There is no active infection.  Patient Active Problem List   Diagnosis Date Noted   Duodenal ulcer 02/02/2023   Symptomatic anemia 01/31/2023   Hypocalcemia 01/31/2023   DMII (diabetes mellitus, type 2) ([place]) 01/31/2023   Hypothyroidism 01/31/2023   Essential hypertension 01/31/2023   Hyperlipidemia 01/31/2023   GERD (gastroesophageal reflux disease) 01/31/2023   Arthritis 01/31/2023   Past Medical History:  Diagnosis Date   Arthritis    Cancer ([place])    skin   DM (diabetes mellitus) ([place])    History of kidney stones    Hyperlipidemia    Hypertension    Hypothyroidism    Sleep apnea     Past Surgical History:  Procedure Laterality Date   BIOPSY  02/02/2023   Procedure: BIOPSY;  Surgeon: Clarene Essex, MD;  Location: [facility];  Service:  Gastroenterology;;   ESOPHAGOGASTRODUODENOSCOPY (EGD) WITH PROPOFOL N/A 02/02/2023   Procedure: ESOPHAGOGASTRODUODENOSCOPY (EGD) WITH PROPOFOL;  Surgeon: Clarene Essex, MD;  Location: [facility];  Service: Gastroenterology;  Laterality: N/A;   HERNIA REPAIR     REPLACEMENT TOTAL KNEE     TOTAL SHOULDER REPLACEMENT      No current facility-administered medications for this encounter.   Current Outpatient Medications  Medication Sig Dispense Refill Last Dose   aspirin EC 81 MG tablet Take 81 mg by mouth daily. Swallow whole.      atorvastatin (LIPITOR) 20 MG tablet Take 20 mg by mouth daily.      Cholecalciferol (VITAMIN D) 125 MCG (5000 UT) CAPS Take 500 Units by mouth daily.      enalapril (VASOTEC) 10 MG tablet Take 10 mg by mouth daily.      escitalopram (LEXAPRO) 10 MG tablet Take 10 mg by mouth daily.      FARXIGA 5 MG TABS tablet Take 5 mg by mouth daily.      ferrous sulfate 325 (65 FE) MG EC tablet Take 325 mg by mouth daily.      insulin glargine (LANTUS) 100 UNIT/ML injection Inject 15 Units into the skin daily.      insulin lispro (HUMALOG KWIKPEN) 100 UNIT/ML KwikPen Inject 4 Units into the skin 3 (three) times daily before meals.      latanoprost (XALATAN) 0.005 % ophthalmic solution Place 1 drop into both eyes at bedtime.      levothyroxine (SYNTHROID, LEVOTHROID) 150 MCG tablet Take 150 mcg by mouth daily before breakfast.      metFORMIN (GLUCOPHAGE-XR) 500 MG 24 hr tablet Take  1,000 mg by mouth daily with supper.      OZEMPIC, 2 MG/DOSE, 8 MG/3ML SOPN Inject 2 mg into the skin every Saturday.      pantoprazole (PROTONIX) 40 MG tablet 1 tablet twice a day for 1 month, then 1 tablet daily 60 tablet 3    tadalafil (CIALIS) 20 MG tablet Take 20 mg by mouth daily as needed for erectile dysfunction.      Allergies  Allergen Reactions   Hydrocodone Hives   Morphine Itching and Swelling   Oxycontin [Oxycodone Hcl] Hives    Social History   Tobacco Use   Smoking status: Former     Types: Cigarettes   Smokeless tobacco: Never  Substance Use Topics   Alcohol use: Not Currently    Comment: drink 3 beers a week    Family History  Problem Relation Age of Onset   Aortic aneurysm Mother      Review of Systems  Musculoskeletal:  Positive for arthralgias.       Right knee  All other systems reviewed and are negative.   Objective:  Physical Exam Constitutional:      Appearance: Normal appearance.  HENT:     Head: Normocephalic and atraumatic.     Nose: Nose normal.     Mouth/Throat:     Pharynx: Oropharynx is clear.  Eyes:     Extraocular Movements: Extraocular movements intact.  Pulmonary:     Effort: Pulmonary effort is normal.  Abdominal:     Palpations: Abdomen is soft.  Musculoskeletal:     Cervical back: Normal range of motion.     Comments: Right knee motion is 0-120.  He has medial joint line pain and crepitation but no effusion.  He has no scars on that knee.  Opposite knee moves about the same with a well-healed incision.    Skin:    General: Skin is warm and dry.  Neurological:     General: No focal deficit present.     Mental Status: He is alert and oriented to person, place, and time.  Psychiatric:        Mood and Affect: Mood normal.        Behavior: Behavior normal.        Thought Content: Thought content normal.        Judgment: Judgment normal.     Vital signs in last 24 hours:    Labs:   Estimated body mass index is 32.28 kg/m as calculated from the following:   Height as of 03/03/23: '5\' 10"'$  (1.778 m).   Weight as of 03/03/23: 102.1 kg.   Imaging Review Plain radiographs demonstrate severe degenerative joint disease of the right knee(s). The overall alignment isneutral. The bone quality appears to be good for age and reported activity level.      Assessment/Plan:  End stage primary arthritis, right knee   The patient history, physical examination, clinical judgment of the provider and imaging studies are  consistent with end stage degenerative joint disease of the right knee(s) and total knee arthroplasty is deemed medically necessary. The treatment options including medical management, injection therapy arthroscopy and arthroplasty were discussed at length. The risks and benefits of total knee arthroplasty were presented and reviewed. The risks due to aseptic loosening, infection, stiffness, patella tracking problems, thromboembolic complications and other imponderables were discussed. The patient acknowledged the explanation, agreed to proceed with the plan and consent was signed. Patient is being admitted for inpatient treatment for surgery, pain control, PT,  OT, prophylactic antibiotics, VTE prophylaxis, progressive ambulation and ADL's and discharge planning. The patient is planning to be discharged home with home health services  Patient's anticipated LOS is less than 2 midnights, meeting these requirements: - Younger than 55 - Lives within 1 hour of care - Has a competent adult at home to recover with post-op recover - NO history of  - Chronic pain requiring opiods  - Diabetes  - Coronary Artery Disease  - Heart failure  - Heart attack  - Stroke  - DVT/VTE  - Cardiac arrhythmia  - Respiratory Failure/COPD  - Renal failure  - Anemia  - Advanced Liver disease

## 2023-03-11 ENCOUNTER — Ambulatory Visit (HOSPITAL_BASED_OUTPATIENT_CLINIC_OR_DEPARTMENT_OTHER): Payer: Medicare Other | Admitting: Anesthesiology

## 2023-03-11 ENCOUNTER — Ambulatory Visit (HOSPITAL_COMMUNITY)
Admission: RE | Admit: 2023-03-11 | Discharge: 2023-03-11 | Disposition: A | Discharge status: Home / Self Care | Payer: Medicare Other | Attending: Orthopaedic Surgery | Admitting: Orthopaedic Surgery

## 2023-03-11 ENCOUNTER — Encounter (HOSPITAL_COMMUNITY)
Admission: RE | Disposition: A | Discharge status: Home / Self Care | Payer: Self-pay | Source: Home / Self Care | Attending: Orthopaedic Surgery

## 2023-03-11 ENCOUNTER — Encounter (HOSPITAL_COMMUNITY): Payer: Self-pay | Admitting: Orthopaedic Surgery

## 2023-03-11 ENCOUNTER — Ambulatory Visit (HOSPITAL_COMMUNITY): Payer: Medicare Other | Admitting: Physician Assistant

## 2023-03-11 ENCOUNTER — Other Ambulatory Visit: Payer: Self-pay

## 2023-03-11 DIAGNOSIS — Z7984 Long term (current) use of oral hypoglycemic drugs: Secondary | ICD-10-CM | POA: Insufficient documentation

## 2023-03-11 DIAGNOSIS — Z794 Long term (current) use of insulin: Secondary | ICD-10-CM | POA: Insufficient documentation

## 2023-03-11 DIAGNOSIS — Z87891 Personal history of nicotine dependence: Secondary | ICD-10-CM | POA: Insufficient documentation

## 2023-03-11 DIAGNOSIS — E039 Hypothyroidism, unspecified: Secondary | ICD-10-CM | POA: Diagnosis not present

## 2023-03-11 DIAGNOSIS — M1711 Unilateral primary osteoarthritis, right knee: Secondary | ICD-10-CM

## 2023-03-11 DIAGNOSIS — K219 Gastro-esophageal reflux disease without esophagitis: Secondary | ICD-10-CM | POA: Insufficient documentation

## 2023-03-11 DIAGNOSIS — R2689 Other abnormalities of gait and mobility: Secondary | ICD-10-CM | POA: Insufficient documentation

## 2023-03-11 DIAGNOSIS — Z8711 Personal history of peptic ulcer disease: Secondary | ICD-10-CM | POA: Insufficient documentation

## 2023-03-11 DIAGNOSIS — M1712 Unilateral primary osteoarthritis, left knee: Secondary | ICD-10-CM | POA: Diagnosis present

## 2023-03-11 DIAGNOSIS — E119 Type 2 diabetes mellitus without complications: Secondary | ICD-10-CM | POA: Insufficient documentation

## 2023-03-11 DIAGNOSIS — G4730 Sleep apnea, unspecified: Secondary | ICD-10-CM | POA: Diagnosis not present

## 2023-03-11 DIAGNOSIS — I10 Essential (primary) hypertension: Secondary | ICD-10-CM | POA: Diagnosis not present

## 2023-03-11 DIAGNOSIS — E1169 Type 2 diabetes mellitus with other specified complication: Secondary | ICD-10-CM

## 2023-03-11 DIAGNOSIS — Z01818 Encounter for other preprocedural examination: Secondary | ICD-10-CM

## 2023-03-11 HISTORY — PX: TOTAL KNEE ARTHROPLASTY: SHX125

## 2023-03-11 LAB — TYPE AND SCREEN
ABO/RH(D): O POS
ABO/RH(D): O POS
Antibody Screen: NEGATIVE
Antibody Screen: NEGATIVE

## 2023-03-11 LAB — GLUCOSE, CAPILLARY
Glucose-Capillary: 159 mg/dL — ABNORMAL HIGH (ref 70–99)
Glucose-Capillary: 148 mg/dL — ABNORMAL HIGH (ref 70–99)

## 2023-03-11 SURGERY — ARTHROPLASTY, KNEE, TOTAL
Anesthesia: Monitor Anesthesia Care | Site: Knee | Laterality: Right

## 2023-03-11 MED ORDER — LACTATED RINGERS IV BOLUS
250.0000 mL | Freq: Once | INTRAVENOUS | Status: AC
  Administered 2023-03-11: 250 mL via INTRAVENOUS

## 2023-03-11 MED ORDER — ONDANSETRON HCL 4 MG/2ML IJ SOLN: 4.0000 mg | Freq: Four times a day (QID) | INTRAMUSCULAR | Status: DC | PRN

## 2023-03-11 MED ORDER — FENTANYL CITRATE (PF) 100 MCG/2ML IJ SOLN
INTRAMUSCULAR | Status: AC
  Filled 2023-03-11: qty 2

## 2023-03-11 MED ORDER — SODIUM CHLORIDE (PF) 0.9 % IJ SOLN
INTRAMUSCULAR | Status: AC
  Filled 2023-03-11: qty 50

## 2023-03-11 MED ORDER — KETOROLAC TROMETHAMINE 15 MG/ML IJ SOLN: 7.5000 mg | Freq: Four times a day (QID) | INTRAMUSCULAR | Status: DC

## 2023-03-11 MED ORDER — BUPIVACAINE-EPINEPHRINE 0.25% -1:200000 IJ SOLN
INTRAMUSCULAR | Status: DC | PRN
  Administered 2023-03-11: 30 mL

## 2023-03-11 MED ORDER — LIDOCAINE 2% (20 MG/ML) 5 ML SYRINGE
INTRAMUSCULAR | Status: DC | PRN
  Administered 2023-03-11: 50 mg via INTRAVENOUS

## 2023-03-11 MED ORDER — TIZANIDINE HCL 4 MG PO TABS: 4.0000 mg | ORAL_TABLET | Freq: Four times a day (QID) | ORAL | 1 refills | Status: AC | PRN

## 2023-03-11 MED ORDER — TRANEXAMIC ACID 1000 MG/10ML IV SOLN
INTRAVENOUS | Status: DC | PRN
  Administered 2023-03-11: 2000 mg via TOPICAL

## 2023-03-11 MED ORDER — BUPIVACAINE IN DEXTROSE 0.75-8.25 % IT SOLN
INTRATHECAL | Status: DC | PRN
  Administered 2023-03-11: 2 mL via INTRATHECAL

## 2023-03-11 MED ORDER — HYDROMORPHONE HCL 2 MG PO TABS: 1.0000 mg | ORAL_TABLET | ORAL | Status: DC | PRN

## 2023-03-11 MED ORDER — METHOCARBAMOL 500 MG PO TABS: 500.0000 mg | ORAL_TABLET | Freq: Four times a day (QID) | ORAL | Status: DC | PRN

## 2023-03-11 MED ORDER — ONDANSETRON HCL 4 MG/2ML IJ SOLN
INTRAMUSCULAR | Status: DC | PRN
  Administered 2023-03-11: 4 mg via INTRAVENOUS

## 2023-03-11 MED ORDER — TRANEXAMIC ACID-NACL 1000-0.7 MG/100ML-% IV SOLN: 1000.0000 mg | Freq: Once | INTRAVENOUS | Status: AC

## 2023-03-11 MED ORDER — ONDANSETRON HCL 4 MG/2ML IJ SOLN
INTRAMUSCULAR | Status: AC
  Filled 2023-03-11: qty 4

## 2023-03-11 MED ORDER — PROPOFOL 10 MG/ML IV BOLUS
INTRAVENOUS | Status: AC
  Filled 2023-03-11: qty 20

## 2023-03-11 MED ORDER — CEFAZOLIN SODIUM-DEXTROSE 2-4 GM/100ML-% IV SOLN: 2.0000 g | Freq: Four times a day (QID) | INTRAVENOUS | Status: DC

## 2023-03-11 MED ORDER — DEXAMETHASONE SODIUM PHOSPHATE 10 MG/ML IJ SOLN
INTRAMUSCULAR | Status: AC
  Filled 2023-03-11: qty 1

## 2023-03-11 MED ORDER — CEFAZOLIN SODIUM-DEXTROSE 2-4 GM/100ML-% IV SOLN
2.0000 g | INTRAVENOUS | Status: AC
  Administered 2023-03-11: 2 g via INTRAVENOUS
  Filled 2023-03-11: qty 100

## 2023-03-11 MED ORDER — CEFAZOLIN SODIUM-DEXTROSE 2-4 GM/100ML-% IV SOLN
INTRAVENOUS | Status: AC
  Administered 2023-03-11: 2 g via INTRAVENOUS
  Filled 2023-03-11: qty 100

## 2023-03-11 MED ORDER — BUPIVACAINE LIPOSOME 1.3 % IJ SUSP
INTRAMUSCULAR | Status: AC
  Filled 2023-03-11: qty 20

## 2023-03-11 MED ORDER — FENTANYL CITRATE (PF) 100 MCG/2ML IJ SOLN
INTRAMUSCULAR | Status: DC | PRN
  Administered 2023-03-11 (×2): 50 ug via INTRAVENOUS

## 2023-03-11 MED ORDER — LACTATED RINGERS IV SOLN: INTRAVENOUS | Status: DC

## 2023-03-11 MED ORDER — ONDANSETRON HCL 4 MG PO TABS: 4.0000 mg | ORAL_TABLET | Freq: Four times a day (QID) | ORAL | Status: DC | PRN

## 2023-03-11 MED ORDER — MIDAZOLAM HCL 2 MG/2ML IJ SOLN
INTRAMUSCULAR | Status: AC
  Filled 2023-03-11: qty 2

## 2023-03-11 MED ORDER — 0.9 % SODIUM CHLORIDE (POUR BTL) OPTIME
TOPICAL | Status: DC | PRN
  Administered 2023-03-11: 1000 mL

## 2023-03-11 MED ORDER — METHOCARBAMOL 500 MG IVPB - SIMPLE MED: 500.0000 mg | Freq: Four times a day (QID) | INTRAVENOUS | Status: DC | PRN

## 2023-03-11 MED ORDER — POVIDONE-IODINE 10 % EX SWAB: 2.0000 | Freq: Once | CUTANEOUS | Status: DC

## 2023-03-11 MED ORDER — MIDAZOLAM HCL 5 MG/5ML IJ SOLN
INTRAMUSCULAR | Status: DC | PRN
  Administered 2023-03-11 (×2): 1 mg via INTRAVENOUS

## 2023-03-11 MED ORDER — EPHEDRINE SULFATE-NACL 50-0.9 MG/10ML-% IV SOSY
PREFILLED_SYRINGE | INTRAVENOUS | Status: DC | PRN
  Administered 2023-03-11 (×4): 5 mg via INTRAVENOUS

## 2023-03-11 MED ORDER — BUPIVACAINE-EPINEPHRINE (PF) 0.25% -1:200000 IJ SOLN
INTRAMUSCULAR | Status: AC
  Filled 2023-03-11: qty 30

## 2023-03-11 MED ORDER — METOCLOPRAMIDE HCL 5 MG/ML IJ SOLN: 5.0000 mg | Freq: Three times a day (TID) | INTRAMUSCULAR | Status: DC | PRN

## 2023-03-11 MED ORDER — EPHEDRINE 5 MG/ML INJ
INTRAVENOUS | Status: AC
  Filled 2023-03-11: qty 5

## 2023-03-11 MED ORDER — BUPIVACAINE LIPOSOME 1.3 % IJ SUSP: 20.0000 mL | Freq: Once | INTRAMUSCULAR | Status: DC

## 2023-03-11 MED ORDER — HYDROMORPHONE HCL 1 MG/ML IJ SOLN: 0.5000 mg | INTRAMUSCULAR | Status: DC | PRN

## 2023-03-11 MED ORDER — PROPOFOL 10 MG/ML IV BOLUS
INTRAVENOUS | Status: DC | PRN
  Administered 2023-03-11: 10 mg via INTRAVENOUS

## 2023-03-11 MED ORDER — TRANEXAMIC ACID-NACL 1000-0.7 MG/100ML-% IV SOLN
INTRAVENOUS | Status: AC
  Administered 2023-03-11: 1000 mg via INTRAVENOUS
  Filled 2023-03-11: qty 100

## 2023-03-11 MED ORDER — HYDROMORPHONE HCL 2 MG PO TABS: 2.0000 mg | ORAL_TABLET | Freq: Four times a day (QID) | ORAL | 0 refills | Status: AC | PRN

## 2023-03-11 MED ORDER — DEXAMETHASONE SODIUM PHOSPHATE 10 MG/ML IJ SOLN
INTRAMUSCULAR | Status: DC | PRN
  Administered 2023-03-11: 4 mg via INTRAVENOUS

## 2023-03-11 MED ORDER — TRANEXAMIC ACID-NACL 1000-0.7 MG/100ML-% IV SOLN
1000.0000 mg | INTRAVENOUS | Status: AC
  Administered 2023-03-11: 1000 mg via INTRAVENOUS
  Filled 2023-03-11: qty 100

## 2023-03-11 MED ORDER — ACETAMINOPHEN 325 MG PO TABS: 325.0000 mg | ORAL_TABLET | Freq: Four times a day (QID) | ORAL | Status: DC | PRN

## 2023-03-11 MED ORDER — PROPOFOL 500 MG/50ML IV EMUL
INTRAVENOUS | Status: DC | PRN
  Administered 2023-03-11: 75 ug/kg/min via INTRAVENOUS

## 2023-03-11 MED ORDER — BUPIVACAINE LIPOSOME 1.3 % IJ SUSP
INTRAMUSCULAR | Status: DC | PRN
  Administered 2023-03-11: 20 mL

## 2023-03-11 MED ORDER — BUPIVACAINE HCL (PF) 0.5 % IJ SOLN
INTRAMUSCULAR | Status: DC | PRN
  Administered 2023-03-11: 25 mL via PERINEURAL

## 2023-03-11 MED ORDER — ACETAMINOPHEN 500 MG PO TABS: 1000.0000 mg | ORAL_TABLET | Freq: Four times a day (QID) | ORAL | Status: DC

## 2023-03-11 MED ORDER — TRANEXAMIC ACID 1000 MG/10ML IV SOLN
2000.0000 mg | INTRAVENOUS | Status: DC
  Filled 2023-03-11: qty 20

## 2023-03-11 MED ORDER — SODIUM CHLORIDE (PF) 0.9 % IJ SOLN
INTRAMUSCULAR | Status: DC | PRN
  Administered 2023-03-11: 30 mL

## 2023-03-11 MED ORDER — FENTANYL CITRATE PF 50 MCG/ML IJ SOSY: 25.0000 ug | PREFILLED_SYRINGE | INTRAMUSCULAR | Status: DC | PRN

## 2023-03-11 MED ORDER — ONDANSETRON HCL 4 MG/2ML IJ SOLN
INTRAMUSCULAR | Status: AC
  Filled 2023-03-11: qty 2

## 2023-03-11 MED ORDER — LIDOCAINE HCL (PF) 2 % IJ SOLN
INTRAMUSCULAR | Status: AC
  Filled 2023-03-11: qty 10

## 2023-03-11 MED ORDER — ORAL CARE MOUTH RINSE: 15.0000 mL | Freq: Once | OROMUCOSAL | Status: AC

## 2023-03-11 MED ORDER — METOCLOPRAMIDE HCL 5 MG PO TABS: 5.0000 mg | ORAL_TABLET | Freq: Three times a day (TID) | ORAL | Status: DC | PRN

## 2023-03-11 MED ORDER — PROPOFOL 1000 MG/100ML IV EMUL
INTRAVENOUS | Status: AC
  Filled 2023-03-11: qty 100

## 2023-03-11 MED ORDER — ASPIRIN 81 MG PO TBEC: 81.0000 mg | DELAYED_RELEASE_TABLET | Freq: Two times a day (BID) | ORAL | 12 refills | Status: AC

## 2023-03-11 MED ORDER — HYDROMORPHONE HCL 2 MG PO TABS: 2.0000 mg | ORAL_TABLET | ORAL | Status: DC | PRN

## 2023-03-11 MED ORDER — LACTATED RINGERS IV BOLUS
500.0000 mL | Freq: Once | INTRAVENOUS | Status: AC
  Administered 2023-03-11: 500 mL via INTRAVENOUS

## 2023-03-11 MED ORDER — DEXAMETHASONE SODIUM PHOSPHATE 10 MG/ML IJ SOLN
INTRAMUSCULAR | Status: AC
  Filled 2023-03-11: qty 2

## 2023-03-11 MED ORDER — SODIUM CHLORIDE 0.9 % IR SOLN
Status: DC | PRN
  Administered 2023-03-11: 3000 mL

## 2023-03-11 MED ORDER — CHLORHEXIDINE GLUCONATE 0.12 % MT SOLN
15.0000 mL | Freq: Once | OROMUCOSAL | Status: AC
  Administered 2023-03-11: 15 mL via OROMUCOSAL

## 2023-03-11 SURGICAL SUPPLY — 62 items
ATTUNE MED DOME PAT 41 KNEE (Knees) IMPLANT
ATTUNE PS FEM RT SZ 7 CEM KNEE (Femur) IMPLANT
ATTUNE PSRP INSR SZ7 5 KNEE (Insert) IMPLANT
BAG COUNTER SPONGE SURGICOUNT (BAG) ×1 IMPLANT
BAG DECANTER FOR FLEXI CONT (MISCELLANEOUS) ×1 IMPLANT
BAG SPEC THK2 15X12 ZIP CLS (MISCELLANEOUS) ×1
BAG SPNG CNTER NS LX DISP (BAG) ×1
BAG ZIPLOCK 12X15 (MISCELLANEOUS) ×1 IMPLANT
BASE TIBIAL ATTUNE KNEE SZ9 (Knees) IMPLANT
BLADE SAGITTAL 25.0X1.19X90 (BLADE) ×1 IMPLANT
BLADE SAW SGTL 11.0X1.19X90.0M (BLADE) ×1 IMPLANT
BLADE SURG SZ10 CARB STEEL (BLADE) ×1 IMPLANT
BNDG CMPR 5X62 HK CLSR LF (GAUZE/BANDAGES/DRESSINGS) ×1
BNDG ELASTIC 6INX 5YD STR LF (GAUZE/BANDAGES/DRESSINGS) ×1 IMPLANT
BNDG ELASTIC 6X5.8 VLCR STR LF (GAUZE/BANDAGES/DRESSINGS) IMPLANT
BOOTIES KNEE HIGH SLOAN (MISCELLANEOUS) ×1 IMPLANT
BOWL SMART MIX CTS (DISPOSABLE) ×1 IMPLANT
BSPLAT TIB 9 CMNT ROT PLAT STR (Knees) ×1 IMPLANT
CEMENT HV SMART SET (Cement) ×2 IMPLANT
COVER SURGICAL LIGHT HANDLE (MISCELLANEOUS) ×1 IMPLANT
CUFF TOURN SGL QUICK 34 (TOURNIQUET CUFF) ×1
CUFF TRNQT CYL 34X4.125X (TOURNIQUET CUFF) ×1 IMPLANT
DRAPE TOP 10253 STERILE (DRAPES) ×1 IMPLANT
DRAPE U-SHAPE 47X51 STRL (DRAPES) ×1 IMPLANT
DRSG AQUACEL AG ADV 3.5X10 (GAUZE/BANDAGES/DRESSINGS) ×1 IMPLANT
DURAPREP 26ML APPLICATOR (WOUND CARE) ×2 IMPLANT
ELECT REM PT RETURN 15FT ADLT (MISCELLANEOUS) ×1 IMPLANT
GLOVE BIO SURGEON STRL SZ8 (GLOVE) ×2 IMPLANT
GLOVE BIOGEL PI IND STRL 7.0 (GLOVE) ×1 IMPLANT
GLOVE BIOGEL PI IND STRL 8 (GLOVE) ×2 IMPLANT
GLOVE SURG SYN 7.0 (GLOVE) ×1 IMPLANT
GLOVE SURG SYN 7.0 PF PI (GLOVE) ×1 IMPLANT
GOWN SRG XL LVL 4 BRTHBL STRL (GOWNS) ×1 IMPLANT
GOWN STRL NON-REIN XL LVL4 (GOWNS) ×1
GOWN STRL REUS W/ TWL XL LVL3 (GOWN DISPOSABLE) ×2 IMPLANT
GOWN STRL REUS W/TWL XL LVL3 (GOWN DISPOSABLE) ×2
HANDPIECE INTERPULSE COAX TIP (DISPOSABLE) ×1
HOLDER FOLEY CATH W/STRAP (MISCELLANEOUS) IMPLANT
HOOD PEEL AWAY T7 (MISCELLANEOUS) ×3 IMPLANT
KIT TURNOVER KIT A (KITS) IMPLANT
MANIFOLD NEPTUNE II (INSTRUMENTS) ×1 IMPLANT
NDL HYPO 22X1.5 SAFETY MO (MISCELLANEOUS) ×1 IMPLANT
NEEDLE HYPO 22X1.5 SAFETY MO (MISCELLANEOUS) ×1 IMPLANT
NEEDLE SAFETY HYPO 22GAX1.5 (MISCELLANEOUS) ×1
NS IRRIG 1000ML POUR BTL (IV SOLUTION) ×1 IMPLANT
PACK TOTAL KNEE CUSTOM (KITS) ×1 IMPLANT
PAD ARMBOARD 7.5X6 YLW CONV (MISCELLANEOUS) ×1 IMPLANT
PIN STEINMAN FIXATION KNEE (PIN) IMPLANT
PROTECTOR NERVE ULNAR (MISCELLANEOUS) ×1 IMPLANT
SET HNDPC FAN SPRY TIP SCT (DISPOSABLE) ×1 IMPLANT
SPIKE FLUID TRANSFER (MISCELLANEOUS) ×2 IMPLANT
SUT ETHIBOND NAB CT1 #1 30IN (SUTURE) ×1 IMPLANT
SUT VIC AB 0 CT1 36 (SUTURE) ×1 IMPLANT
SUT VIC AB 2-0 CT1 27 (SUTURE) ×1
SUT VIC AB 2-0 CT1 TAPERPNT 27 (SUTURE) ×1 IMPLANT
SUT VICRYL AB 3-0 FS1 BRD 27IN (SUTURE) ×1 IMPLANT
SUT VLOC 180 0 24IN GS25 (SUTURE) ×1 IMPLANT
TIBIAL BASE ATTUNE KNEE SZ9 (Knees) ×1 IMPLANT
TRAY FOLEY MTR SLVR 16FR STAT (SET/KITS/TRAYS/PACK) IMPLANT
WATER STERILE IRR 1000ML POUR (IV SOLUTION) ×1 IMPLANT
WRAP KNEE MAXI GEL POST OP (GAUZE/BANDAGES/DRESSINGS) ×1 IMPLANT
YANKAUER SUCT BULB TIP NO VENT (SUCTIONS) ×1 IMPLANT

## 2023-03-11 NOTE — Anesthesia Postprocedure Evaluation (Signed)
Anesthesia Post Note  Patient: Dean Zimmerman  Procedure(s) Performed: RIGHT TOTAL KNEE ARTHROPLASTY (Right: Knee)     Patient location during evaluation: PACU Anesthesia Type: Spinal, MAC and Regional Level of consciousness: oriented and awake and alert Pain management: pain level controlled Vital Signs Assessment: post-procedure vital signs reviewed and stable Respiratory status: spontaneous breathing, respiratory function stable and patient connected to nasal cannula oxygen Cardiovascular status: blood pressure returned to baseline and stable Postop Assessment: no headache, no backache and no apparent nausea or vomiting Anesthetic complications: no   No notable events documented.  Last Vitals:  Vitals:   03/11/23 1045 03/11/23 1100  BP: 112/60 123/60  Pulse: 77 73  Resp: 12 18  Temp:    SpO2: 96% 97%    Last Pain:  Vitals:   03/11/23 1100  TempSrc:   PainSc: 0-No pain                 Velecia Ovitt S

## 2023-03-11 NOTE — Interval H&P Note (Signed)
History and Physical Interval Note:  03/11/2023 7:30 AM  Dean Zimmerman  has presented today for surgery, with the diagnosis of RIGHT KNEE DEGENERATIVE JOINT DISEASE.  The various methods of treatment have been discussed with the patient and family. After consideration of risks, benefits and other options for treatment, the patient has consented to  Procedure(s): RIGHT TOTAL KNEE ARTHROPLASTY (Right) as a surgical intervention.  The patient's history has been reviewed, patient examined, no change in status, stable for surgery.  I have reviewed the patient's chart and labs.  Questions were answered to the patient's satisfaction.     Hessie Dibble

## 2023-03-11 NOTE — Anesthesia Procedure Notes (Signed)
Spinal  Patient location during procedure: OR Start time: 03/11/2023 7:36 AM End time: 03/11/2023 7:39 AM Reason for block: surgical anesthesia Staffing Performed: anesthesiologist  Anesthesiologist: Albertha Ghee, MD Performed by: Albertha Ghee, MD Authorized by: Albertha Ghee, MD   Preanesthetic Checklist Completed: patient identified, IV checked, risks and benefits discussed, surgical consent, monitors and equipment checked, pre-op evaluation and timeout performed Spinal Block Patient position: sitting Prep: DuraPrep Patient monitoring: cardiac monitor, continuous pulse ox and blood pressure Approach: left paramedian Location: L3-4 Injection technique: single-shot Needle Needle type: Quincke  Needle gauge: 22 G Needle length: 9 cm Assessment Sensory level: T10 Events: CSF return Additional Notes Functioning IV was confirmed and monitors were applied. Sterile prep and drape, including hand hygiene and sterile gloves were used. The patient was positioned and the spine was prepped. The skin was anesthetized with lidocaine.  Free flow of clear CSF was obtained prior to injecting local anesthetic into the CSF.  The spinal needle aspirated freely following injection.  The needle was carefully withdrawn.  The patient tolerated the procedure well.

## 2023-03-11 NOTE — Op Note (Signed)
PREOP DIAGNOSIS: DJD RIGHT KNEE POSTOP DIAGNOSIS: same PROCEDURE: RIGHT TKR ANESTHESIA: Spinal and MAC ATTENDING SURGEON: Hessie Dibble ASSISTANT: Loni Dolly PA  INDICATIONS FOR PROCEDURE: Dean Zimmerman is a 72 y.o. male who has struggled for a long time with pain due to degenerative arthritis of the right knee.  The patient has failed many conservative non-operative measures and at this point has pain which limits the ability to sleep and walk.  The patient is offered total knee replacement.  Informed operative consent was obtained after discussion of possible risks of anesthesia, infection, neurovascular injury, DVT, and death.  The importance of the post-operative rehabilitation protocol to optimize result was stressed extensively with the patient.  SUMMARY OF FINDINGS AND PROCEDURE:  Dean Zimmerman was taken to the operative suite where under the above anesthesia a right knee replacement was performed.  There were advanced degenerative changes and the bone quality was excellent.  We used the DePuy Attune system and placed size 7 femur, 9 tibia, 41 mm all polyethylene patella, and a size 5 mm spacer.  Loni Dolly PA-C assisted throughout and was invaluable to the completion of the case in that he helped retract and maintain exposure while I placed components.  He also helped close thereby minimizing OR time.  The patient was admitted for appropriate post-op care to include perioperative antibiotics and mechanical and pharmacologic measures for DVT prophylaxis.  DESCRIPTION OF PROCEDURE:  Dean Zimmerman was taken to the operative suite where the above anesthesia was applied.  The patient was positioned supine and prepped and draped in normal sterile fashion.  An appropriate time out was performed.  After the administration of kefzol pre-op antibiotic the leg was elevated and exsanguinated and a tourniquet inflated. A standard longitudinal incision was made on the anterior knee.  Dissection was  carried down to the extensor mechanism.  All appropriate anti-infective measures were used including the pre-operative antibiotic, betadine impregnated drape, and closed hooded exhaust systems for each member of the surgical team.  A medial parapatellar incision was made in the extensor mechanism and the knee cap flipped and the knee flexed.  Some residual meniscal tissues were removed along with any remaining ACL/PCL tissue.  A guide was placed on the tibia and a flat cut was made on it's superior surface.  An intramedullary guide was placed in the femur and was utilized to make anterior and posterior cuts creating an appropriate flexion gap.  A second intramedullary guide was placed in the femur to make a distal cut properly balancing the knee with an extension gap equal to the flexion gap.  The three bones sized to the above mentioned sizes and the appropriate guides were placed and utilized.  A trial reduction was done and the knee easily came to full extension and the patella tracked well on flexion.  The trial components were removed and all bones were cleaned with pulsatile lavage and then dried thoroughly.  Cement was mixed and was pressurized onto the bones followed by placement of the aforementioned components.  Excess cement was trimmed and pressure was held on the components until the cement had hardened.  The tourniquet was deflated and a small amount of bleeding was controlled with cautery and pressure.  The knee was irrigated thoroughly.  The extensor mechanism was re-approximated with #1 ethibond in interrupted fashion.  The knee was flexed and the repair was solid.  The subcutaneous tissues were re-approximated with #0 and #2-0 vicryl and the skin closed with a subcuticular stitch  and steristrips.  A sterile dressing was applied.  Intraoperative fluids, EBL, and tourniquet time can be obtained from anesthesia records.  DISPOSITION:  The patient was taken to recovery room in stable condition and  scheduled to potentially go home same day depending on ability to walk and tolerate liquids.Dean Zimmerman Dean Zimmerman 03/11/2023, 9:17 AM

## 2023-03-11 NOTE — Transfer of Care (Signed)
Immediate Anesthesia Transfer of Care Note  Patient: Dean Zimmerman  Procedure(s) Performed: Procedure(s): RIGHT TOTAL KNEE ARTHROPLASTY (Right)  Patient Location: PACU  Anesthesia Type:General  Level of Consciousness:  sedated, patient cooperative and responds to stimulation  Airway & Oxygen Therapy:Patient Spontanous Breathing and Patient connected to face mask oxgen  Post-op Assessment:  Report given to PACU RN and Post -op Vital signs reviewed and stable  Post vital signs:  Reviewed and stable  Last Vitals:  Vitals:   03/11/23 0627 03/11/23 0934  BP: 126/67 129/67  Pulse: 73 71  Resp: 18 14  Temp: 36.7 C   SpO2: [id number]    Complications: No apparent anesthesia complications

## 2023-03-11 NOTE — Anesthesia Preprocedure Evaluation (Signed)
Anesthesia Evaluation  Patient identified by MRN, date of birth, ID band Patient awake    Reviewed: Allergy & Precautions, H&P , NPO status , Patient's Chart, lab work & pertinent test results  Airway Mallampati: II   Neck ROM: full    Dental   Pulmonary sleep apnea , former smoker   breath sounds clear to auscultation       Cardiovascular hypertension,  Rhythm:regular Rate:Normal     Neuro/Psych    GI/Hepatic PUD,GERD  ,,  Endo/Other  diabetes, Type 2Hypothyroidism    Renal/GU      Musculoskeletal  (+) Arthritis ,    Abdominal   Peds  Hematology   Anesthesia Other Findings   Reproductive/Obstetrics                             Anesthesia Physical Anesthesia Plan  ASA: 2  Anesthesia Plan: Spinal and MAC   Post-op Pain Management: Regional block*   Induction: Intravenous  PONV Risk Score and Plan: 1 and Ondansetron, Propofol infusion, Midazolam and Treatment may vary due to age or medical condition  Airway Management Planned: Simple Face Mask  Additional Equipment:   Intra-op Plan:   Post-operative Plan:   Informed Consent: I have reviewed the patients History and Physical, chart, labs and discussed the procedure including the risks, benefits and alternatives for the proposed anesthesia with the patient or authorized representative who has indicated his/her understanding and acceptance.     Dental advisory given  Plan Discussed with: CRNA, Anesthesiologist and Surgeon  Anesthesia Plan Comments:        Anesthesia Quick Evaluation

## 2023-03-11 NOTE — Anesthesia Procedure Notes (Signed)
Anesthesia Regional Block: Adductor canal block   Pre-Anesthetic Checklist: , timeout performed,  Correct Patient, Correct Site, Correct Laterality,  Correct Procedure, Correct Position, site marked,  Risks and benefits discussed,  Surgical consent,  Pre-op evaluation,  At surgeon's request and post-op pain management  Laterality: Right  Prep: chloraprep       Needles:  Injection technique: Single-shot  Needle Type: Echogenic Needle     Needle Length: 9cm  Needle Gauge: 21     Additional Needles:   Narrative:  Start time: 03/11/2023 7:08 AM End time: 03/11/2023 7:16 AM Injection made incrementally with aspirations every 5 mL.  Performed by: Personally  Anesthesiologist: Albertha Ghee, MD  Additional Notes: Pt tolerated the procedure well.

## 2023-03-11 NOTE — Evaluation (Signed)
Physical Therapy Evaluation Patient Details Name: Dean Zimmerman MRN: [id number] DOB: 03/07/1951 Today's Date: 03/11/2023  History of Present Illness  Pt is 72 yo male s/p R TKA on 03/11/23.  Pt with hx including but not limited to, DM2, arthritis, HTN, HLD, and L TKA  Clinical Impression  Pt is s/p TKA resulting in the deficits listed below (see PT Problem List). Pt is independent at baseline, has home support, and necessary DME.  Pt was seen in PACU for possible same day d/c.  He was able to ambulate 88' with RW and min guard and performed stairs backward with RW to simulate home environment.  He had excellent ROM, pain control, quad activation.  Pt was able to SLR without lag and reports sensation normal but when stood reports R leg felt a little numb.  Pt was demonstrated mild unsteadiness when he lifted hands for toileting task but he was steady when using RW.  Pt was not able to urinate - notified RN.   Pt demonstrates safe gait & transfers with RW in order to return home from PT perspective once discharged by MD.  While in hospital, will continue to benefit from PT for skilled therapy to advance mobility and exercises.           Recommendations for follow up therapy are one component of a multi-disciplinary discharge planning process, led by the attending physician.  Recommendations may be updated based on patient status, additional functional criteria and insurance authorization.  Follow Up Recommendations Follow physician's recommendations for discharge plan and follow up therapies      Assistance Recommended at Discharge Frequent or constant Supervision/Assistance  Patient can return home with the following  A little help with walking and/or transfers;A little help with bathing/dressing/bathroom;Assistance with cooking/housework;Help with stairs or ramp for entrance    Equipment Recommendations None recommended by PT  Recommendations for Other Services       Functional Status  Assessment Patient has had a recent decline in their functional status and demonstrates the ability to make significant improvements in function in a reasonable and predictable amount of time.     Precautions / Restrictions Precautions Precautions: Fall;Knee Restrictions Weight Bearing Restrictions: Yes LLE Weight Bearing: Weight bearing as tolerated      Mobility  Bed Mobility Overal bed mobility: Needs Assistance Bed Mobility: Supine to Sit, Sit to Supine     Supine to sit: Supervision Sit to supine: Supervision        Transfers Overall transfer level: Needs assistance Equipment used: Rolling walker (2 wheels) Transfers: Sit to/from Stand Sit to Stand: Min guard           General transfer comment: Cues for hand placement and R LE management    Ambulation/Gait Ambulation/Gait assistance: Min guard Gait Distance (Feet): 80 Feet Assistive device: Rolling walker (2 wheels) Gait Pattern/deviations: Step-to pattern, Decreased stride length Gait velocity: decreased     General Gait Details: Min guard for safety; educated on sequenceing; steady with RW  Stairs Stairs: Yes Stairs assistance: Min guard Stair Management: Step to pattern Number of Stairs: 4 General stair comments: Started bil rails with min guard and progressed to RW backward.  Wife present and able to assist  Wheelchair Mobility    Modified Rankin (Stroke Patients Only)       Balance Overall balance assessment: Needs assistance Sitting-balance support: No upper extremity supported Sitting balance-Leahy Scale: Good     Standing balance support: Bilateral upper extremity supported, No upper extremity supported, Reliant on  assistive device for balance Standing balance-Leahy Scale: Poor Standing balance comment: Pt mildly unsteady when attempting to use bathroom without UE support but is steady with RW.  Advised use of UE with standing for balance                              Pertinent Vitals/Pain Pain Assessment Pain Assessment: 0-10 Pain Score: 1  Pain Location: Left Pain Descriptors / Indicators: Aching Pain Intervention(s): Limited activity within patient's tolerance, Monitored during session    Home Living Family/patient expects to be discharged to:: Private residence Living Arrangements: Spouse/significant other Available Help at Discharge: Family;Available 24 hours/day Type of Home: House Home Access: Stairs to enter Entrance Stairs-Rails: None Entrance Stairs-Number of Steps: 2   Home Layout: One level Home Equipment: Conservation officer, nature (2 wheels);Cane - single point;Crutches      Prior Function Prior Level of Function : Independent/Modified Independent;Driving             Mobility Comments: Could ambulate in community without AD ADLs Comments: independent adls and iadls     Hand Dominance        Extremity/Trunk Assessment   Upper Extremity Assessment Upper Extremity Assessment: Overall WFL for tasks assessed    Lower Extremity Assessment Lower Extremity Assessment: LLE deficits/detail;RLE deficits/detail RLE Deficits / Details: Expected post op changes; ROM 0 to 85 degrees; MMT: ankle 5/5, hip and knee 3/5; able to SLR without lag; initially reports normal sensation but when standing reports R leg still a little numb. LLE Deficits / Details: ROM WFL; MMT 5/5    Cervical / Trunk Assessment Cervical / Trunk Assessment: Normal  Communication   Communication: No difficulties  Cognition Arousal/Alertness: Awake/alert Behavior During Therapy: WFL for tasks assessed/performed Overall Cognitive Status: Within Functional Limits for tasks assessed                                          General Comments   Educated on safe ice use, no pivots, car transfers, resting with leg straight, and TED hose during day. Also, encouraged walking every 1-2 hours during day. Educated on HEP with focus on mobility the first  weeks. Discussed doing exercises within pain control and if pain increasing could decreased ROM, reps, and stop exercises as needed. Encouraged to perform quad sets and ankle pumps frequently for blood flow and to promote full knee extension.  Also educated on safety, stair training backward with RW (gave handout) and use of gait belt.     Exercises Total Joint Exercises Ankle Circles/Pumps: AROM, Both, 10 reps, Supine Quad Sets: AROM, Right, 5 reps, Supine Heel Slides: AROM, Right, 5 reps, Supine Hip ABduction/ADduction: AROM, 5 reps, Supine Long Arc Quad: AROM, Right, 5 reps, Seated Knee Flexion: AROM, Right, 5 reps, Seated Goniometric ROM: R knee 0 to 85 degrees Other Exercises Other Exercises: Educated on AAROM techniques if needed and exercises to tolerance   Assessment/Plan    PT Assessment Patient needs continued PT services  PT Problem List Decreased strength;Pain;Decreased range of motion;Decreased activity tolerance;Decreased balance;Decreased mobility;Decreased knowledge of precautions;Decreased knowledge of use of DME       PT Treatment Interventions DME instruction;Therapeutic exercise;Gait training;Balance training;Stair training;Functional mobility training;Therapeutic activities;Patient/family education;Modalities    PT Goals (Current goals can be found in the Care Plan section)  Acute Rehab PT Goals Patient Stated Goal:  return home PT Goal Formulation: With patient/family Time For Goal Achievement: 03/25/23 Potential to Achieve Goals: Good    Frequency 7X/week     Co-evaluation               AM-PAC PT "6 Clicks" Mobility  Outcome Measure Help needed turning from your back to your side while in a flat bed without using bedrails?: A Little Help needed moving from lying on your back to sitting on the side of a flat bed without using bedrails?: A Little Help needed moving to and from a bed to a chair (including a wheelchair)?: A Little Help needed  standing up from a chair using your arms (e.g., wheelchair or bedside chair)?: A Little Help needed to walk in hospital room?: A Little Help needed climbing 3-5 steps with a railing? : A Little 6 Click Score: 18    End of Session Equipment Utilized During Treatment: Gait belt Activity Tolerance: Patient tolerated treatment well Patient left: in bed;with call bell/phone within reach;with family/visitor present Nurse Communication: Mobility status PT Visit Diagnosis: Other abnormalities of gait and mobility (R26.89);Muscle weakness (generalized) (M62.81)    Time: [id number] PT Time Calculation (min) (ACUTE ONLY): 34 min   Charges:   PT Evaluation $PT Eval Low Complexity: 1 Low PT Treatments $Gait Training: 8-22 mins        Abran Richard, PT Acute Rehab [facility] [phone]   Karlton Lemon 03/11/2023, 1:23 PM

## 2023-03-12 ENCOUNTER — Encounter (HOSPITAL_COMMUNITY): Payer: Self-pay | Admitting: Orthopaedic Surgery

## 2023-03-25 ENCOUNTER — Encounter (HOSPITAL_BASED_OUTPATIENT_CLINIC_OR_DEPARTMENT_OTHER): Payer: Self-pay | Admitting: Emergency Medicine

## 2023-03-25 ENCOUNTER — Other Ambulatory Visit: Payer: Self-pay

## 2023-03-25 ENCOUNTER — Emergency Department (HOSPITAL_BASED_OUTPATIENT_CLINIC_OR_DEPARTMENT_OTHER): Payer: Medicare Other

## 2023-03-25 ENCOUNTER — Emergency Department (HOSPITAL_BASED_OUTPATIENT_CLINIC_OR_DEPARTMENT_OTHER)
Admission: EM | Admit: 2023-03-25 | Discharge: 2023-03-25 | Disposition: A | Discharge status: Home / Self Care | Payer: Medicare Other | Attending: Emergency Medicine | Admitting: Emergency Medicine

## 2023-03-25 ENCOUNTER — Other Ambulatory Visit (HOSPITAL_BASED_OUTPATIENT_CLINIC_OR_DEPARTMENT_OTHER): Payer: Self-pay

## 2023-03-25 ENCOUNTER — Emergency Department (HOSPITAL_BASED_OUTPATIENT_CLINIC_OR_DEPARTMENT_OTHER): Payer: Medicare Other | Admitting: Radiology

## 2023-03-25 DIAGNOSIS — Z7984 Long term (current) use of oral hypoglycemic drugs: Secondary | ICD-10-CM | POA: Insufficient documentation

## 2023-03-25 DIAGNOSIS — I10 Essential (primary) hypertension: Secondary | ICD-10-CM | POA: Diagnosis not present

## 2023-03-25 DIAGNOSIS — E119 Type 2 diabetes mellitus without complications: Secondary | ICD-10-CM | POA: Insufficient documentation

## 2023-03-25 DIAGNOSIS — Z794 Long term (current) use of insulin: Secondary | ICD-10-CM | POA: Insufficient documentation

## 2023-03-25 DIAGNOSIS — Z79899 Other long term (current) drug therapy: Secondary | ICD-10-CM | POA: Insufficient documentation

## 2023-03-25 DIAGNOSIS — R0789 Other chest pain: Secondary | ICD-10-CM | POA: Diagnosis present

## 2023-03-25 DIAGNOSIS — I309 Acute pericarditis, unspecified: Secondary | ICD-10-CM | POA: Insufficient documentation

## 2023-03-25 DIAGNOSIS — Z7982 Long term (current) use of aspirin: Secondary | ICD-10-CM | POA: Insufficient documentation

## 2023-03-25 LAB — CBC
HCT: 39 % (ref 39.0–52.0)
Hemoglobin: 12.6 g/dL — ABNORMAL LOW (ref 13.0–17.0)
MCH: 28.3 pg (ref 26.0–34.0)
MCHC: 32.3 g/dL (ref 30.0–36.0)
MCV: 87.4 fL (ref 80.0–100.0)
Platelets: 422 10*3/uL — ABNORMAL HIGH (ref 150–400)
RBC: 4.46 MIL/uL (ref 4.22–5.81)
RDW: 13.5 % (ref 11.5–15.5)
WBC: 13.9 10*3/uL — ABNORMAL HIGH (ref 4.0–10.5)
nRBC: 0 % (ref 0.0–0.2)

## 2023-03-25 LAB — BASIC METABOLIC PANEL
Anion gap: 10 (ref 5–15)
BUN: 16 mg/dL (ref 8–23)
CO2: 26 mmol/L (ref 22–32)
Calcium: 9.9 mg/dL (ref 8.9–10.3)
Chloride: 98 mmol/L (ref 98–111)
Creatinine, Ser: 0.82 mg/dL (ref 0.61–1.24)
GFR, Estimated: >60 mL/min (ref >60)
Glucose, Bld: 191 mg/dL — ABNORMAL HIGH (ref 70–99)
Potassium: 4.2 mmol/L (ref 3.5–5.1)
Sodium: 134 mmol/L — ABNORMAL LOW (ref 135–145)

## 2023-03-25 LAB — SEDIMENTATION RATE: Sed Rate: 35 mm/hr — ABNORMAL HIGH (ref 0–16)

## 2023-03-25 LAB — C-REACTIVE PROTEIN: CRP: 9.3 mg/dL — ABNORMAL HIGH (ref <1.0)

## 2023-03-25 LAB — TROPONIN I (HIGH SENSITIVITY)
Troponin I (High Sensitivity): 5 ng/L (ref <18)
Troponin I (High Sensitivity): 6 ng/L (ref <18)

## 2023-03-25 LAB — D-DIMER, QUANTITATIVE: D-Dimer, Quant: 5.72 ug/mL-FEU — ABNORMAL HIGH (ref 0.00–0.50)

## 2023-03-25 MED ORDER — COLCHICINE 0.6 MG PO TABS
0.6000 mg | ORAL_TABLET | Freq: Two times a day (BID) | ORAL | 0 refills | Status: DC
  Filled 2023-03-25: qty 14, 7d supply, fill #0

## 2023-03-25 MED ORDER — IOHEXOL 350 MG/ML SOLN
100.0000 mL | Freq: Once | INTRAVENOUS | Status: AC | PRN
  Administered 2023-03-25: 75 mL via INTRAVENOUS

## 2023-03-25 NOTE — Discharge Instructions (Addendum)
Your presentation is concerning for something called pericarditis.  This is inflammation of the lining of your heart.  There does not appear to be any involvement of your heart itself, and you are being started on a medicine called colchicine to help.  Sometimes this can cause GI side effects.  You will need to follow-up closely with cardiology.    If you develop recurrent, continued, or worsening chest pain, shortness of breath, fever, vomiting, abdominal or back pain, dizziness or lightheadedness, or any other new/concerning symptoms then return to the ER for evaluation.

## 2023-03-25 NOTE — ED Notes (Signed)
Discharge paperwork given and verbally understood. 

## 2023-03-25 NOTE — ED Provider Notes (Signed)
[facility] Provider Note   CSN: [id number] Arrival date & time: 03/25/23  1408     History  Chief Complaint  Patient presents with   Chest Pain    Dean Zimmerman is a 72 y.o. male.  HPI 72 year old male with a history of diabetes, hypertension, hyperlipidemia presents with chest tightness.  He noticed it first around 6 AM this morning.  It has not gotten better or worse since.  It hurts worst when he takes a deep breath though also he notices it when he lays flat.  No exertional component.  He denies shortness of breath, nausea, vomiting, diaphoresis or radiation of the pain.  He had knee surgery 2 weeks ago which is doing fine.  He has not noticed any leg swelling.  Last month he had a bleeding duodenal ulcer and so he was put on a baby aspirin after surgery but no other anticoagulation.  He is currently on Protonix.  Home Medications Prior to Admission medications   Medication Sig Start Date End Date Taking? Authorizing Provider  acetaminophen (TYLENOL) 500 MG tablet Take 1,000 mg by mouth every 6 (six) hours as needed.   Yes [provider]  aspirin EC 81 MG tablet Take 1 tablet (81 mg total) by mouth 2 (two) times daily after a meal. Swallow whole. 03/11/23  Yes Loni Dolly, PA-C  atorvastatin (LIPITOR) 20 MG tablet Take 20 mg by mouth daily.   Yes [provider]  Cholecalciferol (VITAMIN D) 125 MCG (5000 UT) CAPS Take 500 Units by mouth daily.   Yes [provider]  colchicine 0.6 MG tablet Take 1 tablet (0.6 mg total) by mouth 2 (two) times daily. 03/25/23  Yes Sherwood Gambler, MD  diphenhydrAMINE (BENADRYL) 25 mg capsule Take 50 mg by mouth every 6 (six) hours as needed. With dilaudid   Yes [provider]  enalapril (VASOTEC) 10 MG tablet Take 10 mg by mouth daily.   Yes [provider]  escitalopram (LEXAPRO) 10 MG tablet Take 10 mg by mouth daily.   Yes [provider]  FARXIGA 5  MG TABS tablet Take 5 mg by mouth daily. 05/24/21  Yes [provider]  ferrous sulfate 325 (65 FE) MG EC tablet Take 325 mg by mouth daily.   Yes [provider]  HYDROmorphone (DILAUDID) 2 MG tablet Take 2 mg by mouth every 6 (six) hours as needed. 03/23/23  Yes [provider]  insulin glargine (LANTUS) 100 UNIT/ML injection Inject 10 Units into the skin daily.   Yes [provider]  insulin lispro (HUMALOG KWIKPEN) 100 UNIT/ML KwikPen Inject 4 Units into the skin 3 (three) times daily before meals. 01/29/23  Yes [provider]  latanoprost (XALATAN) 0.005 % ophthalmic solution Place 1 drop into both eyes at bedtime.   Yes [provider]  levothyroxine (SYNTHROID, LEVOTHROID) 150 MCG tablet Take 150 mcg by mouth daily before breakfast.   Yes [provider]  metFORMIN (GLUCOPHAGE-XR) 500 MG 24 hr tablet Take 1,000 mg by mouth daily with supper.   Yes [provider]  OZEMPIC, 2 MG/DOSE, 8 MG/3ML SOPN Inject 2 mg into the skin every Saturday. 10/08/22  Yes [provider]  pantoprazole (PROTONIX) 40 MG tablet 1 tablet twice a day for 1 month, then 1 tablet daily Patient taking differently: 40 mg 2 (two) times daily. 1 tablet twice a day for 1 month, then 1 tablet daily 02/02/23  Yes Dwyane Dee, MD  tiZANidine (ZANAFLEX) 4 MG tablet Take 1 tablet (4 mg total) by mouth every 6 (six) hours as needed for muscle spasms. Patient not taking: Reported on 03/25/2023 03/11/23 03/10/24  Loni Dolly, PA-C      Allergies    Hydrocodone, Morphine, and Oxycontin [oxycodone hcl]    Review of Systems   Review of Systems  Respiratory:  Negative for shortness of breath.   Cardiovascular:  Positive for chest pain. Negative for leg swelling.  Gastrointestinal:  Negative for abdominal pain.  Musculoskeletal:  Negative for back pain.    Physical Exam Updated Vital Signs BP 120/62   Pulse 85   Temp 98.3 F (36.8 C)   Resp 13    Wt 99.8 kg   SpO2 98%   BMI 31.57 kg/m  Physical Exam Vitals and nursing note reviewed.  Constitutional:      General: He is not in acute distress.    Appearance: He is well-developed. He is not ill-appearing or diaphoretic.  HENT:     Head: Normocephalic and atraumatic.  Cardiovascular:     Rate and Rhythm: Normal rate and regular rhythm.     Heart sounds: Normal heart sounds.  Pulmonary:     Effort: Pulmonary effort is normal.     Breath sounds: Normal breath sounds.  Abdominal:     Palpations: Abdomen is soft.     Tenderness: There is no abdominal tenderness.  Skin:    General: Skin is warm and dry.  Neurological:     Mental Status: He is alert.     ED Results / Procedures / Treatments   Labs (all labs ordered are listed, but only abnormal results are displayed) Labs Reviewed  BASIC METABOLIC PANEL - Abnormal; Notable for the following components:      Result Value   Sodium 134 (*)    Glucose, Bld 191 (*)    All other components within normal limits  CBC - Abnormal; Notable for the following components:   WBC 13.9 (*)    Hemoglobin 12.6 (*)    Platelets 422 (*)    All other components within normal limits  D-DIMER, QUANTITATIVE - Abnormal; Notable for the following components:   D-Dimer, Quant 5.72 (*)    All other components within normal limits  SEDIMENTATION RATE - Abnormal; Notable for the following components:   Sed Rate 35 (*)    All other components within normal limits  C-REACTIVE PROTEIN  TROPONIN I (HIGH SENSITIVITY)  TROPONIN I (HIGH SENSITIVITY)    EKG EKG Interpretation  Date/Time:  Tuesday March 25 2023 15:07:08 EDT Ventricular Rate:  85 PR Interval:  186 QRS Duration: 89 QT Interval:  360 QTC Calculation: 428 R Axis:   -10 Text Interpretation: Sinus rhythm Left ventricular hypertrophy ST elevations in multiple territories. no reciprocal changes similar to earlier in the day Confirmed by Sherwood Gambler [phone]) on 03/25/2023 3:19:10  PM  Radiology CT Angio Chest PE W and/or Wo Contrast  Result Date: 03/25/2023 CLINICAL DATA:  Nonradiating chest pain with worsening on deep inspiration. Recent knee surgery. EXAM: CT ANGIOGRAPHY CHEST WITH CONTRAST TECHNIQUE: Multidetector CT imaging of the chest was performed using the standard protocol during bolus administration of intravenous contrast. Multiplanar CT image reconstructions and MIPs were obtained to evaluate the vascular anatomy. RADIATION DOSE REDUCTION: This exam was performed according to the departmental dose-optimization program which includes automated exposure control, adjustment of the mA and/or kV according to patient size and/or use of iterative reconstruction technique. CONTRAST:  67mL OMNIPAQUE  IOHEXOL 350 MG/ML SOLN COMPARISON:  X-ray earlier 03/25/2023.  Noncontrast CT 10/25/2020 FINDINGS: Cardiovascular: No segmental or larger pulmonary embolism identified. There is some breathing motion which can limit evaluation of small and peripheral emboli. Coronary artery calcifications are seen. The heart is nonenlarged. Trace pericardial fluid. The thoracic aorta overall has a normal course and caliber with mild plaque. Mediastinum/Nodes: No specific abnormal lymph node enlargement seen in the axillary region. There are some small nodes in the hila and mediastinum, less than a cm in short axis and not pathologic by size criteria. Slightly patulous thoracic esophagus. Small thyroid gland. Lungs/Pleura: Breathing motion seen scattered throughout the examination. There is some left basilar atelectasis. No consolidation, pneumothorax or effusion otherwise. Upper Abdomen: Along the abdomen the adrenal glands are preserved. Of note the stomach is dilated with significant luminal debris and fluid. Musculoskeletal: Scattered degenerative changes along the spine. There is streak artifact related to the patient's right shoulder arthroplasty. Review of the MIP images confirms the above findings.  IMPRESSION: Breathing motion. No segmental or larger pulmonary embolism identified. Bandlike opacity left lower lobe. Atelectasis is favored over infiltrate. Dilated stomach with significant luminal debris. Please correlate with clinical history Aortic Atherosclerosis (ICD10-I70.0). Electronically Signed   By: Jill Side M.D.   On: 03/25/2023 15:41   DG Chest 2 View  Result Date: 03/25/2023 CLINICAL DATA:  Chest pain EXAM: CHEST - 2 VIEW COMPARISON:  X-ray 03/03/2023 and older FINDINGS: Underinflation. No consolidation, pneumothorax or effusion. No edema. Normal cardiopericardial silhouette. Overlapping cardiac leads. Right shoulder arthroplasty. Degenerative changes are seen along the spine. IMPRESSION: No acute cardiopulmonary disease Electronically Signed   By: Jill Side M.D.   On: 03/25/2023 14:57    Procedures Procedures    Medications Ordered in ED Medications  iohexol (OMNIPAQUE) 350 MG/ML injection 100 mL (75 mLs Intravenous Contrast Given 03/25/23 1524)    ED Course/ Medical Decision Making/ A&P Clinical Course as of 03/25/23 1735  Tue Mar 25, 2023  1525 I discussed case with cardiology on call, Dr. Margaretann Loveless.  ST elevations appear diffuse and so with a negative troponin after many hours of symptoms this is much more likely to be pericarditis if PE is not found on CTA.  She advises ESR, CRP and see if there is a significant effusion on the CT.  If he does well and clinically appears well could be amenable to an outpatient close cardiology follow-up where they can get an ultrasound and follow him.  If he goes home she recommends colchicine 0.6 mg twice daily.  Reconsult if needed. If he gets admitted they can see in consultation. [SG]    Clinical Course User Index [SG] Sherwood Gambler, MD                             Medical Decision Making Amount and/or Complexity of Data Reviewed Labs: ordered.    Details: Troponins normal x 2.  Hemoglobin 12 is better than when he was in the  hospital previously. Radiology: ordered and independent interpretation performed.    Details: CTA without PE.  I do not see a significant pericardial effusion. ECG/medicine tests: ordered and independent interpretation performed.    Details: ST elevations but no reciprocal changes.  Risk Prescription drug management.   Patient overall feels well.  He has some minimal chest pain.  His vitals are stable.  Given his recent surgery a CTA was obtained but there is no PE.  It  was a little limited but I doubt a subsegmental PE would cause his symptoms.  As above, I discussed with cardiology.  Clinical concern is more for pericarditis at this point.  Troponins are negative x 2.  Given this, will avoid NSAIDs given his recent admission and treatment for a duodenal ulcer but instead put on colchicine.  Discussed potential for GI side effects.  Will otherwise give urgent cardiology referral.  We discussed return precautions.  We did briefly discuss potentially observing in the hospital though he prefers to go home which I think is reasonable and he showing no signs or symptoms of significant disease.  He has a trace pericardial effusion but nothing that would be clinically significant at this point.  Will discharge home with return precautions.        Final Clinical Impression(s) / ED Diagnoses Final diagnoses:  Acute pericarditis, unspecified type    Rx / DC Orders ED Discharge Orders          Ordered    colchicine 0.6 MG tablet  2 times daily        03/25/23 1658    Ambulatory referral to Cardiology       Comments: If you have not heard from the Cardiology office within the next 72 hours please call [phone].   03/25/23 1658              Sherwood Gambler, MD 03/25/23 1737

## 2023-03-25 NOTE — ED Triage Notes (Signed)
Pt arrives pov, slow gait with c/o non radiating CP, worsening with deep inspiration that started today. Recent knee repl. surgery x 2 weeks pta.

## 2023-03-26 ENCOUNTER — Encounter: Payer: Self-pay | Admitting: Internal Medicine

## 2023-03-26 ENCOUNTER — Ambulatory Visit: Payer: Medicare Other | Attending: Internal Medicine | Admitting: Internal Medicine

## 2023-03-26 VITALS — BP 110/62 | HR 98 | Ht 70.0 in | Wt 221.8 lb

## 2023-03-26 DIAGNOSIS — I319 Disease of pericardium, unspecified: Secondary | ICD-10-CM

## 2023-03-26 MED ORDER — COLCHICINE 0.6 MG PO TABS: 0.6000 mg | ORAL_TABLET | Freq: Two times a day (BID) | ORAL | 2 refills | Status: AC

## 2023-03-26 NOTE — Patient Instructions (Signed)
Medication Instructions:   Stop Colchicine 06/25/23  *If you need a refill on your cardiac medications before your next appointment, please call your pharmacy*   Lab Work.  Please return for Blood Work TOMORROW. No appointment needed, lab here at the office is open Monday-Friday from 8AM to 4PM and closed daily for lunch from 12:45-1:45.    If you have labs (blood work) drawn today and your tests are completely normal, you will receive your results only by: [facility] (if you have MyChart) OR A paper copy in the mail If you have any lab test that is abnormal or we need to change your treatment, we will call you to review the results.  Testing/Procedures: Your physician has requested that you have an echocardiogram. Echocardiography is a painless test that uses sound waves to create images of your heart. It provides your doctor with information about the size and shape of your heart and how well your heart's chambers and valves are working. This procedure takes approximately one hour. There are no restrictions for this procedure. Please do NOT wear cologne, perfume, aftershave, or lotions (deodorant is allowed). Please arrive 15 minutes prior to your appointment time.   Follow-Up: At [facility], you and your health needs are our priority.  As part of our continuing mission to provide you with exceptional heart care, we have created designated Provider Care Teams.  These Care Teams include your primary Cardiologist (physician) and Advanced Practice Providers (APPs -  Physician Assistants and Nurse Practitioners) who all work together to provide you with the care you need, when you need it.  Your next appointment:   3 month(s)  Provider:   Janina Mayo, MD

## 2023-03-26 NOTE — Progress Notes (Signed)
Cardiology Office Note:    Date:  03/26/2023   ID:  Dean Zimmerman, DOB 05/01/1951, MRN [id number]  PCP:  Glenis Smoker, MD   [facility] Providers Cardiologist:  None     Referring MD: Sherwood Gambler, MD   No chief complaint on file. Pericarditis  History of Present Illness:    Dean Zimmerman is a 73 y.o. male with a hx of HTN, DM2, OSA referral for concern for pericarditis in the emergency room. He was not admitted. He was referred to DOD today. He reported acute pleuritic CP and went to the ED. EKG showed diffuse STE c/f pericarditis. His ESR and CRP were elevated. CT showed a trace effusion. No large PE. No masses. White count was mildly elevated at 13. Troponin was negative. He was started on colchicine.   Hgb 12 was down to 7 a month ago. He had a CT abdomen in at that time showed duodenitis. He was reporting dark tarry stools at that time as well. He was seen by GI. EGD showed grade A reflux esophagitis with no bleeding. Nonbleeding duodenal ulcer. He was managed with a PPI. He was transfused 1 unit. Acute anemia was thought 2/2 GIB 2/2 nsaid use   Today he reports, it is still hard to take a deep breath. He denies fevers or chills. 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 Recently had right knee surgery.   Past Medical History:  Diagnosis Date   Arthritis    Cancer ([facility])    skin   DM (diabetes mellitus) ([facility])    History of kidney stones    Hyperlipidemia    Hypertension    Hypothyroidism    Sleep apnea     Past Surgical History:  Procedure Laterality Date   BIOPSY  02/02/2023   Procedure: BIOPSY;  Surgeon: Clarene Essex, MD;  Location: [place];  Service: Gastroenterology;;   ESOPHAGOGASTRODUODENOSCOPY (EGD) WITH PROPOFOL N/A 02/02/2023   Procedure: ESOPHAGOGASTRODUODENOSCOPY (EGD) WITH PROPOFOL;  Surgeon: Clarene Essex, MD;  Location: [facility];  Service: Gastroenterology;  Laterality: N/A;   HERNIA REPAIR     REPLACEMENT TOTAL KNEE      TOTAL KNEE ARTHROPLASTY Right 03/11/2023   Procedure: RIGHT TOTAL KNEE ARTHROPLASTY;  Surgeon: Melrose Nakayama, MD;  Location: [facility];  Service: Orthopedics;  Laterality: Right;   TOTAL SHOULDER REPLACEMENT      Current Medications: No outpatient medications have been marked as taking for the 03/26/23 encounter (Appointment) with Janina Mayo, MD.     Allergies:   Hydrocodone, Morphine, and Oxycontin [oxycodone hcl]   Social History   Socioeconomic History   Marital status: Married    Spouse name: Not on file   Number of children: Not on file   Years of education: Not on file   Highest education level: Not on file  Occupational History   Not on file  Tobacco Use   Smoking status: Former    Types: Cigarettes   Smokeless tobacco: Never  Vaping Use   Vaping Use: Never used  Substance and Sexual Activity   Alcohol use: Not Currently    Comment: drink 3 beers a week   Drug use: Not Currently   Sexual activity: Not on file  Other Topics Concern   Not on file  Social History Narrative   Not on file   Social Determinants of Health   Financial Resource Strain: Not on file  Food Insecurity: Not on file  Transportation Needs: Not on file  Physical Activity:  Not on file  Stress: Not on file  Social Connections: Not on file     Family History: The patient's family history includes Aortic aneurysm in his mother.  ROS:   Please see the history of present illness.     All other systems reviewed and are negative.  EKGs/Labs/Other Studies Reviewed:    The following studies were reviewed today:   EKG:  no new  Recent Labs: 01/31/2023: ALT 50 03/25/2023: BUN 16; Creatinine, Ser 0.82; Hemoglobin 12.6; Platelets 422; Potassium 4.2; Sodium 134  Recent Lipid Panel No results found for: "CHOL", "TRIG", "HDL", "CHOLHDL", "VLDL", "LDLCALC", "LDLDIRECT"   Risk Assessment/Calculations:     Physical Exam:    VS:   Vitals:   03/26/23 1640  BP: 110/62  Pulse: 98  SpO2: 98%     Wt Readings from Last 3 Encounters:  03/25/23 220 lb (99.8 kg)  03/11/23 225 lb (102.1 kg)  03/03/23 225 lb (102.1 kg)     GEN:  Well nourished, well developed in no acute distress HEENT: Normal NECK: No JVD; No carotid bruits LYMPHATICS: No lymphadenopathy CARDIAC: RRR, no murmurs, rubs, gallops RESPIRATORY:  Clear to auscultation without rales, wheezing or rhonchi  ABDOMEN: Soft, non-tender, non-distended MUSCULOSKELETAL:  No edema; No deformity  SKIN: Warm and dry NEUROLOGIC:  Alert and oriented x 3 PSYCHIATRIC:  Normal affect   ASSESSMENT:    Acute Pericarditis: Unclear etiology. Will get a TTE to look for an effusion. Will get HIV/ANA as well. Agree with planned colonoscopy.  On 2 81 mg of asa for month, can continue this. On a PPI as well. Continue colchicine 0.6 for 3 months.   DM2: A1c goal < 7. Continue farxiga 5 mg daily  HTN: continue home medication  HLD: continue lipitor 20 mg daily  PLAN:    In order of problems listed above:  TTE HIV, ANA Can stop colchicine 0.6 mg in June after 3 months Follow up in 3 months      Medication Adjustments/Labs and Tests Ordered: Current medicines are reviewed at length with the patient today.  Concerns regarding medicines are outlined above.  No orders of the defined types were placed in this encounter.  No orders of the defined types were placed in this encounter.   There are no Patient Instructions on file for this visit.   Signed, Janina Mayo, MD  03/26/2023 3:40 PM    [place]

## 2023-03-28 LAB — ANA W/REFLEX: Anti Nuclear Antibody (ANA): NEGATIVE

## 2023-03-31 ENCOUNTER — Telehealth: Payer: Self-pay | Admitting: Internal Medicine

## 2023-03-31 NOTE — Telephone Encounter (Signed)
Pt c/o medication issue:  1. Name of Medication: colchicine 0.6 MG tablet   2. How are you currently taking this medication (dosage and times per day)? 1 tablet twice a day  3. Are you having a reaction (difficulty breathing--STAT)? no  4. What is your medication issue? Patient's wife states the medication gave him severe diarrhea. She says he took imodium yesterday and it helped. She is not sure if he keeps taking imodium with the colchicine or if he needs to stop the colchicine.

## 2023-03-31 NOTE — Telephone Encounter (Signed)
Called patient, spoke with wife (okay per DPR)  Advised patient was seen in ED on 03/26, and seen in office by Dr.Branch on 03/27- recommended to continue colchicine until June- patient wife states on Saturday he had two episodes of bad diarrhea,  and by the end of the day it was watery stool. Patient wife advised that they gave him imodium and that improved the symptoms, this morning he had loose stool, but nothing like the other day per wife. Patient wife denies any other symptoms, no abdominal pain, but states they did not want to come off the colchicine because it improved the other symptoms, however they are not sure if he can stay on imodium for this amount of time. Advised I would route to MD to review and give recommendations. Wife verbalized understanding  Thanks!

## 2023-04-01 NOTE — Telephone Encounter (Signed)
Called patient wife, and patient advised of the following.  They will try a few different things for the next week and will call for an update.

## 2023-04-02 ENCOUNTER — Ambulatory Visit (HOSPITAL_COMMUNITY): Payer: Medicare Other | Attending: Internal Medicine

## 2023-04-02 DIAGNOSIS — I319 Disease of pericardium, unspecified: Secondary | ICD-10-CM | POA: Diagnosis present

## 2023-04-02 LAB — ECHOCARDIOGRAM COMPLETE
Area-P 1/2: 3.5 cm2
Calc EF: 61.2 %
P 1/2 time: 334 msec
S' Lateral: 4 cm
Single Plane A2C EF: 62.8 %
Single Plane A4C EF: 60.2 %

## 2023-04-11 ENCOUNTER — Telehealth: Payer: Self-pay | Admitting: Internal Medicine

## 2023-04-11 NOTE — Telephone Encounter (Signed)
Patient's wife stated that he is doing a lot better and he is able to take the medication without any issues.

## 2023-04-11 NOTE — Telephone Encounter (Signed)
Pt c/o medication issue:  1. Name of Medication:  colchicine 0.6 MG tablet   2. How are you currently taking this medication (dosage and times per day)? Take 1 tablet (0.6 mg total) by mouth 2 (two) times daily   3. Are you having a reaction (difficulty breathing--STAT)? No   4. What is your medication issue? Patient's wife called stating they were supposed to give a report about how the patient was doing with the medication after 10 days. Patient's wife stated that he is doing a lot better and he is able to take the medication without any issues.

## 2023-04-14 NOTE — Telephone Encounter (Signed)
Noted, thank you

## 2023-06-11 ENCOUNTER — Encounter: Payer: Self-pay | Admitting: Internal Medicine

## 2023-06-11 ENCOUNTER — Ambulatory Visit: Payer: Medicare Other | Attending: Internal Medicine | Admitting: Internal Medicine

## 2023-06-11 VITALS — BP 110/54 | HR 86 | Ht 70.0 in | Wt 228.0 lb

## 2023-06-11 DIAGNOSIS — I319 Disease of pericardium, unspecified: Secondary | ICD-10-CM

## 2023-06-11 NOTE — Patient Instructions (Addendum)
Medication Instructions:   Stop Colchicine 06/25/23   *If you need a refill on your cardiac medications before your next appointment, please call your pharmacy*   Lab Work:  Not needed   Testing/Procedures:  Not needed  Follow-Up: At [facility], you and your health needs are our priority.  As part of our continuing mission to provide you with exceptional heart care, we have created designated Provider Care Teams.  These Care Teams include your primary Cardiologist (physician) and Advanced Practice Providers (APPs -  Physician Assistants and Nurse Practitioners) who all work together to provide you with the care you need, when you need it.     Your next appointment:     As needed  The format for your next appointment:   In Person  Provider:   Maisie Fus, MD

## 2023-06-11 NOTE — Progress Notes (Signed)
Cardiology Office Note:    Date:  06/11/2023   ID:  Caster Fayette, DOB [birth year], MRN [id number]  PCP:  Shon Hale, MD   [facility] Providers Cardiologist:  Maisie Fus, MD     Referring MD: Shon Hale, *   No chief complaint on file. Pericarditis  History of Present Illness:    Dean Zimmerman is a 72 y.o. male with a hx of HTN, DM2, OSA referral for concern for pericarditis in the emergency room. He was not admitted. He was referred to DOD today. He reported acute pleuritic CP and went to the ED. EKG showed diffuse STE c/f pericarditis. His ESR and CRP were elevated. CT showed a trace effusion. No large PE. No masses. White count was mildly elevated at 13. Troponin was negative. He was started on colchicine.   Hgb 12 was down to 7 a month ago. He had a CT abdomen in at that time showed duodenitis. He was reporting dark tarry stools at that time as well. He was seen by GI. EGD showed grade A reflux esophagitis with no bleeding. Nonbleeding duodenal ulcer. He was managed with a PPI. He was transfused 1 unit. Acute anemia was thought 2/2 GIB 2/2 nsaid use   Today he reports, it is still hard to take a deep breath. He denies fevers or chills. 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 Recently had right knee surgery.   Interim Hx 06/11/2023 FU after diagnosis of pericarditis. He is off aspirin. No chest pain. No SOB.   Past Medical History:  Diagnosis Date   Arthritis    Cancer ([facility])    skin   DM (diabetes mellitus) ([facility])    History of kidney stones    Hyperlipidemia    Hypertension    Hypothyroidism    Sleep apnea     Past Surgical History:  Procedure Laterality Date   BIOPSY  02/02/2023   Procedure: BIOPSY;  Surgeon: Vida Rigger, MD;  Location: [facility];  Service: Gastroenterology;;   ESOPHAGOGASTRODUODENOSCOPY (EGD) WITH PROPOFOL N/A 02/02/2023   Procedure: ESOPHAGOGASTRODUODENOSCOPY (EGD) WITH PROPOFOL;  Surgeon: Vida Rigger, MD;   Location: [facility];  Service: Gastroenterology;  Laterality: N/A;   HERNIA REPAIR     REPLACEMENT TOTAL KNEE     TOTAL KNEE ARTHROPLASTY Right 03/11/2023   Procedure: RIGHT TOTAL KNEE ARTHROPLASTY;  Surgeon: Marcene Corning, MD;  Location: [facility];  Service: Orthopedics;  Laterality: Right;   TOTAL SHOULDER REPLACEMENT      Current Medications: Current Outpatient Medications on File Prior to Visit  Medication Sig Dispense Refill   acetaminophen (TYLENOL) 500 MG tablet Take 1,000 mg by mouth every 6 (six) hours as needed.     atorvastatin (LIPITOR) 20 MG tablet Take 20 mg by mouth daily.     Cholecalciferol (VITAMIN D) 125 MCG (5000 UT) CAPS Take 500 Units by mouth daily.     colchicine 0.6 MG tablet Take 1 tablet (0.6 mg total) by mouth 2 (two) times daily. Stop on June 26th, 2024 60 tablet 2   enalapril (VASOTEC) 10 MG tablet Take 10 mg by mouth daily.     escitalopram (LEXAPRO) 10 MG tablet Take 10 mg by mouth daily.     FARXIGA 5 MG TABS tablet Take 5 mg by mouth daily.     ferrous sulfate 325 (65 FE) MG EC tablet Take 325 mg by mouth daily.     HYDROmorphone (DILAUDID) 2 MG tablet Take 2 mg by mouth every  6 (six) hours as needed.     insulin glargine (LANTUS) 100 UNIT/ML injection Inject 10 Units into the skin daily.     insulin lispro (HUMALOG KWIKPEN) 100 UNIT/ML KwikPen Inject 4 Units into the skin 3 (three) times daily before meals.     latanoprost (XALATAN) 0.005 % ophthalmic solution Place 1 drop into both eyes at bedtime.     levothyroxine (SYNTHROID, LEVOTHROID) 150 MCG tablet Take 150 mcg by mouth daily before breakfast.     metFORMIN (GLUCOPHAGE-XR) 500 MG 24 hr tablet Take 1,000 mg by mouth daily with supper.     OZEMPIC, 2 MG/DOSE, 8 MG/3ML SOPN Inject 2 mg into the skin every Saturday.     pantoprazole (PROTONIX) 40 MG tablet 1 tablet twice a day for 1 month, then 1 tablet daily (Patient taking differently: 40 mg 2 (two) times daily. 1 tablet twice a day for 1 month, then  1 tablet daily) 60 tablet 3   aspirin EC 81 MG tablet Take 1 tablet (81 mg total) by mouth 2 (two) times daily after a meal. Swallow whole. 30 tablet 12   diphenhydrAMINE (BENADRYL) 25 mg capsule Take 50 mg by mouth every 6 (six) hours as needed. With dilaudid     tiZANidine (ZANAFLEX) 4 MG tablet Take 1 tablet (4 mg total) by mouth every 6 (six) hours as needed for muscle spasms. (Patient not taking: Reported on 03/25/2023) 40 tablet 1   No current facility-administered medications on file prior to visit.     Allergies:   Hydrocodone, Morphine, and Oxycontin [oxycodone hcl]   Social History   Socioeconomic History   Marital status: Married    Spouse name: Not on file   Number of children: Not on file   Years of education: Not on file   Highest education level: Not on file  Occupational History   Not on file  Tobacco Use   Smoking status: Former    Types: Cigarettes   Smokeless tobacco: Never  Vaping Use   Vaping Use: Never used  Substance and Sexual Activity   Alcohol use: Not Currently    Comment: drink 3 beers a week   Drug use: Not Currently   Sexual activity: Not on file  Other Topics Concern   Not on file  Social History Narrative   Not on file   Social Determinants of Health   Financial Resource Strain: Not on file  Food Insecurity: Not on file  Transportation Needs: Not on file  Physical Activity: Not on file  Stress: Not on file  Social Connections: Not on file     Family History: The patient's family history includes Aortic aneurysm in his mother.  ROS:   Please see the history of present illness.     All other systems reviewed and are negative.  EKGs/Labs/Other Studies Reviewed:    The following studies were reviewed today:  TTE: 04/02/2023 Normal LV fxn/RV fxn No significant valve dx Small pericardial effusion  EKG:  no new  Recent Labs: 01/31/2023: ALT 50 03/25/2023: BUN 16; Creatinine, Ser 0.82; Hemoglobin 12.6; Platelets 422; Potassium 4.2;  Sodium 134  Recent Lipid Panel No results found for: "CHOL", "TRIG", "HDL", "CHOLHDL", "VLDL", "LDLCALC", "LDLDIRECT"   Risk Assessment/Calculations:     Physical Exam:    VS:   Vitals:   06/11/23 1316  BP: (!) 110/54  Pulse: 86  SpO2: 96%     Wt Readings from Last 3 Encounters:  06/11/23 228 lb (103.4 kg)  03/26/23 221  lb 12.8 oz (100.6 kg)  03/25/23 220 lb (99.8 kg)     GEN:  Well nourished, well developed in no acute distress HEENT: Normal NECK: No JVD; No carotid bruits LYMPHATICS: No lymphadenopathy CARDIAC: RRR, no murmurs, rubs, gallops RESPIRATORY:  Clear to auscultation without rales, wheezing or rhonchi  ABDOMEN: Soft, non-tender, non-distended MUSCULOSKELETAL:  No edema; No deformity  SKIN: Warm and dry NEUROLOGIC:  Alert and oriented x 3 PSYCHIATRIC:  Normal affect   ASSESSMENT:    Acute Pericarditis: Unclear etiology.  - managed on ASA and colchicine 0.6 mg BID. Off asa and will be finishing colchicine soon June 26th. - TTE showed small pericardial effusion - ANA negative - HIV not collected  DM2: A1c goal < 7. Continue farxiga 5 mg daily  HTN: continue home medication  HLD: continue lipitor 20 mg daily  PLAN:    In order of problems listed above:  Follow up PRN      Medication Adjustments/Labs and Tests Ordered: Current medicines are reviewed at length with the patient today.  Concerns regarding medicines are outlined above.  No orders of the defined types were placed in this encounter.  No orders of the defined types were placed in this encounter.   There are no Patient Instructions on file for this visit.   Signed, Maisie Fus, MD  06/11/2023 1:36 PM    [facility]

## 2024-02-05 ENCOUNTER — Other Ambulatory Visit: Payer: Self-pay

## 2024-02-05 ENCOUNTER — Emergency Department (HOSPITAL_BASED_OUTPATIENT_CLINIC_OR_DEPARTMENT_OTHER)
Admission: EM | Admit: 2024-02-05 | Discharge: 2024-02-06 | Disposition: A | Discharge status: Home / Self Care | Payer: Medicare Other | Attending: Emergency Medicine | Admitting: Emergency Medicine

## 2024-02-05 ENCOUNTER — Encounter (HOSPITAL_BASED_OUTPATIENT_CLINIC_OR_DEPARTMENT_OTHER): Payer: Self-pay | Admitting: Emergency Medicine

## 2024-02-05 ENCOUNTER — Emergency Department (HOSPITAL_BASED_OUTPATIENT_CLINIC_OR_DEPARTMENT_OTHER): Payer: Medicare Other

## 2024-02-05 DIAGNOSIS — R112 Nausea with vomiting, unspecified: Secondary | ICD-10-CM | POA: Insufficient documentation

## 2024-02-05 DIAGNOSIS — R197 Diarrhea, unspecified: Secondary | ICD-10-CM | POA: Insufficient documentation

## 2024-02-05 DIAGNOSIS — Z79899 Other long term (current) drug therapy: Secondary | ICD-10-CM | POA: Insufficient documentation

## 2024-02-05 DIAGNOSIS — I10 Essential (primary) hypertension: Secondary | ICD-10-CM | POA: Insufficient documentation

## 2024-02-05 DIAGNOSIS — Z7982 Long term (current) use of aspirin: Secondary | ICD-10-CM | POA: Diagnosis not present

## 2024-02-05 DIAGNOSIS — D72829 Elevated white blood cell count, unspecified: Secondary | ICD-10-CM | POA: Insufficient documentation

## 2024-02-05 DIAGNOSIS — Z85828 Personal history of other malignant neoplasm of skin: Secondary | ICD-10-CM | POA: Diagnosis not present

## 2024-02-05 DIAGNOSIS — Z7984 Long term (current) use of oral hypoglycemic drugs: Secondary | ICD-10-CM | POA: Diagnosis not present

## 2024-02-05 DIAGNOSIS — Z794 Long term (current) use of insulin: Secondary | ICD-10-CM | POA: Insufficient documentation

## 2024-02-05 DIAGNOSIS — R1033 Periumbilical pain: Secondary | ICD-10-CM | POA: Insufficient documentation

## 2024-02-05 DIAGNOSIS — E1165 Type 2 diabetes mellitus with hyperglycemia: Secondary | ICD-10-CM | POA: Diagnosis not present

## 2024-02-05 DIAGNOSIS — R1013 Epigastric pain: Secondary | ICD-10-CM | POA: Insufficient documentation

## 2024-02-05 HISTORY — DX: Gastric ulcer, unspecified as acute or chronic, without hemorrhage or perforation: K25.9

## 2024-02-05 LAB — CBC WITH DIFFERENTIAL/PLATELET
Abs Immature Granulocytes: 0.12 10*3/uL — ABNORMAL HIGH (ref 0.00–0.07)
Basophils Absolute: 0.1 10*3/uL (ref 0.0–0.1)
Basophils Relative: 0 %
Eosinophils Absolute: 0.1 10*3/uL (ref 0.0–0.5)
Eosinophils Relative: 0 %
HCT: 51.1 % (ref 39.0–52.0)
Hemoglobin: 17.2 g/dL — ABNORMAL HIGH (ref 13.0–17.0)
Immature Granulocytes: 1 %
Lymphocytes Relative: 4 %
Lymphs Abs: 0.9 10*3/uL (ref 0.7–4.0)
MCH: 30.2 pg (ref 26.0–34.0)
MCHC: 33.7 g/dL (ref 30.0–36.0)
MCV: 89.8 fL (ref 80.0–100.0)
Monocytes Absolute: 1.3 10*3/uL — ABNORMAL HIGH (ref 0.1–1.0)
Monocytes Relative: 6 %
Neutro Abs: 18.6 10*3/uL — ABNORMAL HIGH (ref 1.7–7.7)
Neutrophils Relative %: 89 %
Platelets: 260 10*3/uL (ref 150–400)
RBC: 5.69 MIL/uL (ref 4.22–5.81)
RDW: 12.5 % (ref 11.5–15.5)
WBC: 21.1 10*3/uL — ABNORMAL HIGH (ref 4.0–10.5)
nRBC: 0 % (ref 0.0–0.2)

## 2024-02-05 LAB — COMPREHENSIVE METABOLIC PANEL
ALT: 27 U/L (ref 0–44)
AST: 23 U/L (ref 15–41)
Albumin: 4.2 g/dL (ref 3.5–5.0)
Alkaline Phosphatase: 73 U/L (ref 38–126)
Anion gap: 12 (ref 5–15)
BUN: 29 mg/dL — ABNORMAL HIGH (ref 8–23)
CO2: 23 mmol/L (ref 22–32)
Calcium: 9.7 mg/dL (ref 8.9–10.3)
Chloride: 104 mmol/L (ref 98–111)
Creatinine, Ser: 1.21 mg/dL (ref 0.61–1.24)
GFR, Estimated: >60 mL/min (ref >60)
Glucose, Bld: 225 mg/dL — ABNORMAL HIGH (ref 70–99)
Potassium: 4.4 mmol/L (ref 3.5–5.1)
Sodium: 139 mmol/L (ref 135–145)
Total Bilirubin: 1.2 mg/dL (ref 0.0–1.2)
Total Protein: 7.4 g/dL (ref 6.5–8.1)

## 2024-02-05 LAB — URINALYSIS, ROUTINE W REFLEX MICROSCOPIC
Bacteria, UA: NONE SEEN
Bilirubin Urine: NEGATIVE
Glucose, UA: >1000 mg/dL — AB
Ketones, ur: 15 mg/dL — AB
Leukocytes,Ua: NEGATIVE
Nitrite: NEGATIVE
Protein, ur: 30 mg/dL — AB
Specific Gravity, Urine: >1.046 — ABNORMAL HIGH (ref 1.005–1.030)
pH: 6 (ref 5.0–8.0)

## 2024-02-05 LAB — LIPASE, BLOOD: Lipase: 26 U/L (ref 11–51)

## 2024-02-05 MED ORDER — ONDANSETRON HCL 4 MG/2ML IJ SOLN
4.0000 mg | Freq: Once | INTRAMUSCULAR | Status: AC
  Administered 2024-02-05: 4 mg via INTRAVENOUS
  Filled 2024-02-05: qty 2

## 2024-02-05 MED ORDER — IOHEXOL 300 MG/ML  SOLN
100.0000 mL | Freq: Once | INTRAMUSCULAR | Status: AC | PRN
  Administered 2024-02-05: 100 mL via INTRAVENOUS

## 2024-02-05 MED ORDER — SODIUM CHLORIDE 0.9 % IV BOLUS
1000.0000 mL | Freq: Once | INTRAVENOUS | Status: AC
  Administered 2024-02-05: 1000 mL via INTRAVENOUS

## 2024-02-05 NOTE — ED Provider Triage Note (Signed)
 Emergency Medicine Provider Triage Evaluation Note  Hart Haas , a 73 y.o. male  was evaluated in triage.  Pt complains of nausea and vomiting with abdominal pain. No abdominal pain currently. Would like imaging. History of gastric ulcer.  Review of Systems  Positive: As above Negative: As above  Physical Exam  BP (!) 113/58 (BP Location: Right Arm)   Pulse 93   Temp 98.2 F (36.8 C)   Resp 18   SpO2 98%  Gen:   Awake, no distress   Resp:  Normal effort MSK:   Moves extremities without difficulty  Other:    Medical Decision Making  Medically screening exam initiated at 4:00 PM.  Appropriate orders placed.  Jebidiah Baggerly was informed that the remainder of the evaluation will be completed by another provider, this initial triage assessment does not replace that evaluation, and the importance of remaining in the ED until their evaluation is complete.     Hildegard Loge, PA-C 02/05/24 1601

## 2024-02-05 NOTE — ED Provider Notes (Signed)
 [place] EMERGENCY DEPARTMENT AT [facility] Provider Note  CSN: [id number] Arrival date & time: 02/05/24 1521  Chief Complaint(s) Emesis  HPI Dean Zimmerman is a 73 y.o. male with a past medical history listed below including hypertension, diabetes, gastric ulcers here for over 24 hours of nausea, vomiting, diarrhea with periumbilical/epigastric abdominal pain.  Patient has had decreased oral intake and tolerance over that timeframe.  He reports possible suspicious food intake stating that he got sick the last time he had chicken from [employer] and had some yesterday as well.  His wife had the same chicken and did not become sick.  Patient arrived at the time above and was MSE need due to prolonged wait times from census.  Appropriate labs and imaging obtained.  On my evaluation, the patient had been here for approximately 8 hours already.  He reported that his pain subsided just prior to arriving to the emergency department. Pain has not recurred.  He reports having the last nonbloody diarrhea just prior to arriving.  He reports that the vomiting was nonbloody.  He reports prior history of hernia repair.  The history is provided by the patient.    Past Medical History Past Medical History:  Diagnosis Date   Arthritis    Cancer (HCC)    skin   DM (diabetes mellitus) (HCC)    Gastric ulcer    History of kidney stones    Hyperlipidemia    Hypertension    Hypothyroidism    Sleep apnea    Patient Active Problem List   Diagnosis Date Noted   Primary osteoarthritis of left knee 03/11/2023   Duodenal ulcer 02/02/2023   Symptomatic anemia 01/31/2023   Hypocalcemia 01/31/2023   DMII (diabetes mellitus, type 2) (HCC) 01/31/2023   Hypothyroidism 01/31/2023   Essential hypertension 01/31/2023   Hyperlipidemia 01/31/2023   GERD (gastroesophageal reflux disease) 01/31/2023   Arthritis 01/31/2023   Home Medication(s) Prior to Admission medications   Medication Sig Start Date  End Date Taking? Authorizing Provider  ondansetron  (ZOFRAN -ODT) 4 MG disintegrating tablet Take 1 tablet (4 mg total) by mouth every 8 (eight) hours as needed for up to 3 days for nausea or vomiting. 02/06/24 02/09/24 Yes Lamiyah Schlotter, Raynell Moder, MD  acetaminophen  (TYLENOL ) 500 MG tablet Take 1,000 mg by mouth every 6 (six) hours as needed.    [provider]  aspirin  EC 81 MG tablet Take 1 tablet (81 mg total) by mouth 2 (two) times daily after a meal. Swallow whole. 03/11/23   Lenis Barter, PA-C  atorvastatin  (LIPITOR) 20 MG tablet Take 20 mg by mouth daily.    [provider]  Cholecalciferol (VITAMIN D) 125 MCG (5000 UT) CAPS Take 500 Units by mouth daily.    [provider]  colchicine  0.6 MG tablet Take 1 tablet (0.6 mg total) by mouth 2 (two) times daily. Stop on June 26th, 2024 03/26/23   Alvan Ronal BRAVO, MD  diphenhydrAMINE (BENADRYL) 25 mg capsule Take 50 mg by mouth every 6 (six) hours as needed. With dilaudid     [provider]  enalapril  (VASOTEC ) 10 MG tablet Take 10 mg by mouth daily.    [provider]  escitalopram  (LEXAPRO ) 10 MG tablet Take 10 mg by mouth daily.    [provider]  FARXIGA 5 MG TABS tablet Take 5 mg by mouth daily. 05/24/21   [provider]  ferrous sulfate 325 (65 FE) MG EC tablet Take 325 mg by mouth daily.    [provider]  HYDROmorphone  (DILAUDID ) 2 MG tablet Take 2 mg by mouth every 6 (six) hours as needed. 03/23/23   [provider]  insulin  glargine (LANTUS ) 100 UNIT/ML injection Inject 10 Units into the skin daily.    [provider]  insulin  lispro (HUMALOG KWIKPEN) 100 UNIT/ML KwikPen Inject 4 Units into the skin 3 (three) times daily before meals. 01/29/23   [provider]  latanoprost  (XALATAN ) 0.005 % ophthalmic solution Place 1 drop into both eyes at bedtime.    [provider]  levothyroxine  (SYNTHROID , LEVOTHROID) 150 MCG tablet Take 150 mcg by  mouth daily before breakfast.    [provider]  metFORMIN (GLUCOPHAGE-XR) 500 MG 24 hr tablet Take 1,000 mg by mouth daily with supper.    [provider]  OZEMPIC, 2 MG/DOSE, 8 MG/3ML SOPN Inject 2 mg into the skin every Saturday. 10/08/22   [provider]  pantoprazole  (PROTONIX ) 40 MG tablet 1 tablet twice a day for 1 month, then 1 tablet daily Patient taking differently: 40 mg 2 (two) times daily. 1 tablet twice a day for 1 month, then 1 tablet daily 02/02/23   [name], MD  tiZANidine  (ZANAFLEX ) 4 MG tablet Take 1 tablet (4 mg total) by mouth every 6 (six) hours as needed for muscle spasms. Patient not taking: Reported on 03/25/2023 03/11/23 03/10/24  Nida, Andrew, PA-C                                                                                                                                    Allergies Hydrocodone, Morphine, and Oxycontin [oxycodone hcl]  Review of Systems Review of Systems As noted in HPI  Physical Exam Vital Signs  I have reviewed the triage vital signs BP 121/70   Pulse 71   Temp 98 F (36.7 C)   Resp 18   SpO2 98%   Physical Exam Vitals reviewed.  Constitutional:      General: He is not in acute distress.    Appearance: He is well-developed. He is not diaphoretic.  HENT:     Head: Normocephalic and atraumatic.     Right Ear: External ear normal.     Left Ear: External ear normal.     Nose: Nose normal.     Mouth/Throat:     Mouth: Mucous membranes are moist.  Eyes:     General: No scleral icterus.    Conjunctiva/sclera: Conjunctivae normal.  Neck:     Trachea: Phonation normal.  Cardiovascular:     Rate and Rhythm: Normal rate and regular rhythm.  Pulmonary:     Effort: Pulmonary effort is normal. No respiratory distress.     Breath sounds: No stridor.  Abdominal:     General: There is no distension.     Tenderness: There is no abdominal tenderness. There is no guarding or rebound.  Musculoskeletal:         General: Normal  range of motion.     Cervical back: Normal range of motion.  Skin:      Neurological:     Mental Status: He is alert and oriented to person, place, and time.  Psychiatric:        Behavior: Behavior normal.     ED Results and Treatments Labs (all labs ordered are listed, but only abnormal results are displayed) Labs Reviewed  CBC WITH DIFFERENTIAL/PLATELET - Abnormal; Notable for the following components:      Result Value   WBC 21.1 (*)    Hemoglobin 17.2 (*)    Neutro Abs 18.6 (*)    Monocytes Absolute 1.3 (*)    Abs Immature Granulocytes 0.12 (*)    All other components within normal limits  COMPREHENSIVE METABOLIC PANEL - Abnormal; Notable for the following components:   Glucose, Bld 225 (*)    BUN 29 (*)    All other components within normal limits  URINALYSIS, ROUTINE W REFLEX MICROSCOPIC - Abnormal; Notable for the following components:   Specific Gravity, Urine >1.046 (*)    Glucose, UA >1,000 (*)    Hgb urine dipstick TRACE (*)    Ketones, ur 15 (*)    Protein, ur 30 (*)    All other components within normal limits  LIPASE, BLOOD                                                                                                                         EKG  EKG Interpretation Date/Time:    Ventricular Rate:    PR Interval:    QRS Duration:    QT Interval:    QTC Calculation:   R Axis:      Text Interpretation:         Radiology CT ABDOMEN PELVIS W CONTRAST Result Date: 02/05/2024 CLINICAL DATA:  Nausea vomiting abdomen pain EXAM: CT ABDOMEN AND PELVIS WITH CONTRAST TECHNIQUE: Multidetector CT imaging of the abdomen and pelvis was performed using the standard protocol following bolus administration of intravenous contrast. RADIATION DOSE REDUCTION: This exam was performed according to the departmental dose-optimization program which includes automated exposure control, adjustment of the mA and/or kV according to patient size and/or use of  iterative reconstruction technique. CONTRAST:  OMNIPAQUE  IOHEXOL  300 MG/ML  SOLN COMPARISON:  CT 01/31/2023 FINDINGS: Lower chest: Lung bases demonstrate no acute airspace disease. Coronary vascular calcification. Hepatobiliary: No focal liver abnormality is seen. No gallstones, gallbladder wall thickening, or biliary dilatation. Pancreas: Unremarkable. No pancreatic ductal dilatation or surrounding inflammatory changes. Spleen: Normal in size without focal abnormality. Adrenals/Urinary Tract: Adrenal glands are normal. Kidneys show no hydronephrosis. The bladder is slightly thick walled but decompressed Stomach/Bowel: Moderate fluid distension of the stomach. Multiple fluid-filled loops of small bowel with areas of mucosal enhancement. Possible mild bowel wall thickening of loops within the pelvis. Some mesenteric vascular congestion. Some fluid in the colon. No colon wall thickening Vascular/Lymphatic: Moderate aortic atherosclerosis. No aneurysm. No suspicious lymph nodes Reproductive:  Negative prostate Other: Negative for free air or pelvic effusion. Small fat containing inguinal hernias. Small fat containing ventral hernia. Small wide neck umbilical hernia containing fat and anterior aspect of short segment of small bowel. Musculoskeletal: No acute osseous abnormality IMPRESSION: 1. Fluid-filled nondilated small bowel with suspected areas of mild small bowel thickening and mucosal enhancement, findings favored to represent enteritis with low-grade or developing bowel obstruction not excluded. Some mesenteric vascular congestion associated with slightly thickened small bowel loops in the pelvis. CT follow-up may be indicated depending upon clinical course. 2. Slightly thick-walled but decompressed urinary bladder which may be correlated with urinalysis. 3. Aortic atherosclerosis. Aortic Atherosclerosis (ICD10-I70.0). Electronically Signed   By: Luke Bun M.D.   On: 02/05/2024 17:14    Medications  Ordered in ED Medications  iohexol  (OMNIPAQUE ) 300 MG/ML solution 100 mL (100 mLs Intravenous Contrast Given 02/05/24 1647)  ondansetron  (ZOFRAN ) injection 4 mg (4 mg Intravenous Given 02/05/24 2308)  sodium chloride  0.9 % bolus 1,000 mL (0 mLs Intravenous Stopped 02/06/24 0010)   Procedures Procedures  (including critical care time) Medical Decision Making / ED Course   Medical Decision Making Amount and/or Complexity of Data Reviewed Labs: ordered. Decision-making details documented in ED Course. Radiology: ordered and independent interpretation performed. Decision-making details documented in ED Course.  Risk Prescription drug management. Decision regarding hospitalization.    Abdominal pain with nausea vomiting, diarrhea Differential diagnosis and workup considered  Workup was notable for evidence of enteritis with possible early or developing bowel obstruction.  No other acute findings on CT scan.  CBC does show leukocytosis with an elevated hemoglobin and hematocrit likely from concentration.  CMP without significant electrolyte derangements.  Patient has hyperglycemia without evidence of DKA.  No renal sufficiency.  No evidence of bili obstruction or pancreatitis.  UA without infection.  Patient was provided with antiemetics and IV fluids.  Able to tolerate p.o.  Patient was given option for admission for 24-hour observation versus close monitoring at home with strict return precautions.  Patient has a continuous glucose monitor and instructed to play close attention to his sugar levels.  Instructed to have a liquid diet for the next 48 to 72 hours.      Final Clinical Impression(s) / ED Diagnoses Final diagnoses:  Nausea vomiting and diarrhea   The patient appears reasonably screened and/or stabilized for discharge and I doubt any other medical condition or other [facility] requiring further screening, evaluation, or treatment in the ED at this time. I have discussed the findings, Dx  and Tx plan with the patient/family who expressed understanding and agree(s) with the plan. Discharge instructions discussed at length. The patient/family was given strict return precautions who verbalized understanding of the instructions. No further questions at time of discharge.  Disposition: Discharge  Condition: Good  ED Discharge Orders          Ordered    ondansetron  (ZOFRAN -ODT) 4 MG disintegrating tablet  Every 8 hours PRN        02/06/24 0048             Follow Up: Chrystal Lamarr RAMAN, MD [address] [place] [address] [phone]  Call  to schedule an appointment for close follow up    This chart was dictated using voice recognition software.  Despite best efforts to proofread,  errors can occur which can change the documentation meaning.    Trine Raynell Moder, MD 02/06/24 [phone]

## 2024-02-05 NOTE — ED Notes (Signed)
 Patient notified of need for urine sample to complete eval/assessment. Specimen cup provided and instructions for clean catch given.

## 2024-02-05 NOTE — ED Triage Notes (Signed)
 Abdo pain N/v/d  Hx gastric  ulcer Started last night

## 2024-02-06 MED ORDER — ONDANSETRON 4 MG PO TBDP: 4.0000 mg | ORAL_TABLET | Freq: Three times a day (TID) | ORAL | 0 refills | Status: AC | PRN

## 2024-02-06 NOTE — Discharge Instructions (Signed)
 Thank you for allowing us  to take care of you today.  We hope you begin feeling better soon.  To-Do: Please follow-up with your primary doctor. Please return to the Emergency Department or call 911 if you experience severe abdominal pain with continued nausea, vomiting and inability to tolerate oral intake despite the use of antiemetics, chest pain, shortness of breath, severe fever, altered mental status, or have any reason to think that you need emergency medical care.  Thank you again.  Hope you feel better soon.  Department of Emergency Medicine [facility]

## 2024-04-22 ENCOUNTER — Other Ambulatory Visit: Payer: Self-pay | Admitting: Orthopaedic Surgery

## 2024-04-22 DIAGNOSIS — M25512 Pain in left shoulder: Secondary | ICD-10-CM

## 2024-04-29 ENCOUNTER — Ambulatory Visit
Admission: RE | Admit: 2024-04-29 | Discharge: 2024-04-29 | Disposition: A | Discharge status: Home / Self Care | Source: Ambulatory Visit | Attending: Orthopaedic Surgery | Admitting: Orthopaedic Surgery

## 2024-04-29 DIAGNOSIS — M25512 Pain in left shoulder: Secondary | ICD-10-CM

## 2024-11-12 ENCOUNTER — Other Ambulatory Visit: Payer: Self-pay

## 2024-11-12 ENCOUNTER — Emergency Department (HOSPITAL_BASED_OUTPATIENT_CLINIC_OR_DEPARTMENT_OTHER)
Admission: EM | Admit: 2024-11-12 | Discharge: 2024-11-13 | Disposition: A | Discharge status: Home / Self Care | Attending: Emergency Medicine | Admitting: Emergency Medicine

## 2024-11-12 ENCOUNTER — Emergency Department (HOSPITAL_BASED_OUTPATIENT_CLINIC_OR_DEPARTMENT_OTHER)

## 2024-11-12 DIAGNOSIS — Z7982 Long term (current) use of aspirin: Secondary | ICD-10-CM | POA: Insufficient documentation

## 2024-11-12 DIAGNOSIS — R1013 Epigastric pain: Secondary | ICD-10-CM | POA: Diagnosis present

## 2024-11-12 DIAGNOSIS — D72829 Elevated white blood cell count, unspecified: Secondary | ICD-10-CM | POA: Diagnosis not present

## 2024-11-12 DIAGNOSIS — Z87891 Personal history of nicotine dependence: Secondary | ICD-10-CM | POA: Diagnosis not present

## 2024-11-12 DIAGNOSIS — R112 Nausea with vomiting, unspecified: Secondary | ICD-10-CM | POA: Insufficient documentation

## 2024-11-12 DIAGNOSIS — Z794 Long term (current) use of insulin: Secondary | ICD-10-CM | POA: Diagnosis not present

## 2024-11-12 DIAGNOSIS — R739 Hyperglycemia, unspecified: Secondary | ICD-10-CM | POA: Diagnosis not present

## 2024-11-12 LAB — COMPREHENSIVE METABOLIC PANEL WITH GFR
ALT: 29 U/L (ref 0–44)
AST: 33 U/L (ref 15–41)
Albumin: 4.8 g/dL (ref 3.5–5.0)
Alkaline Phosphatase: 95 U/L (ref 38–126)
Anion gap: 14 (ref 5–15)
BUN: 21 mg/dL (ref 8–23)
CO2: 26 mmol/L (ref 22–32)
Calcium: 10.8 mg/dL — ABNORMAL HIGH (ref 8.9–10.3)
Chloride: 103 mmol/L (ref 98–111)
Creatinine, Ser: 0.73 mg/dL (ref 0.61–1.24)
GFR, Estimated: >60 mL/min (ref >60)
Glucose, Bld: 217 mg/dL — ABNORMAL HIGH (ref 70–99)
Potassium: 4.2 mmol/L (ref 3.5–5.1)
Sodium: 142 mmol/L (ref 135–145)
Total Bilirubin: 1.4 mg/dL — ABNORMAL HIGH (ref 0.0–1.2)
Total Protein: 7.1 g/dL (ref 6.5–8.1)

## 2024-11-12 LAB — URINALYSIS, ROUTINE W REFLEX MICROSCOPIC
Bacteria, UA: NONE SEEN
Bilirubin Urine: NEGATIVE
Glucose, UA: >1000 mg/dL — AB
Ketones, ur: >80 mg/dL — AB
Leukocytes,Ua: NEGATIVE
Nitrite: NEGATIVE
Protein, ur: NEGATIVE mg/dL
Specific Gravity, Urine: 1.036 — ABNORMAL HIGH (ref 1.005–1.030)
pH: 5.5 (ref 5.0–8.0)

## 2024-11-12 LAB — CBC
HCT: 47.4 % (ref 39.0–52.0)
Hemoglobin: 16.4 g/dL (ref 13.0–17.0)
MCH: 30.2 pg (ref 26.0–34.0)
MCHC: 34.6 g/dL (ref 30.0–36.0)
MCV: 87.3 fL (ref 80.0–100.0)
Platelets: 222 K/uL (ref 150–400)
RBC: 5.43 MIL/uL (ref 4.22–5.81)
RDW: 12.6 % (ref 11.5–15.5)
WBC: 11.5 K/uL — ABNORMAL HIGH (ref 4.0–10.5)
nRBC: 0 % (ref 0.0–0.2)

## 2024-11-12 LAB — CBG MONITORING, ED: Glucose-Capillary: 233 mg/dL — ABNORMAL HIGH (ref 70–99)

## 2024-11-12 LAB — TROPONIN T, HIGH SENSITIVITY
Troponin T High Sensitivity: 25 ng/L — ABNORMAL HIGH (ref 0–19)
Troponin T High Sensitivity: 24 ng/L — ABNORMAL HIGH (ref 0–19)

## 2024-11-12 LAB — LIPASE, BLOOD: Lipase: 61 U/L — ABNORMAL HIGH (ref 11–51)

## 2024-11-12 MED ORDER — ONDANSETRON HCL 4 MG/2ML IJ SOLN
4.0000 mg | Freq: Once | INTRAMUSCULAR | Status: AC
  Administered 2024-11-12: 4 mg via INTRAVENOUS
  Filled 2024-11-12: qty 2

## 2024-11-12 MED ORDER — PANTOPRAZOLE SODIUM 40 MG PO TBEC: 40.0000 mg | DELAYED_RELEASE_TABLET | Freq: Two times a day (BID) | ORAL | 0 refills | Status: AC

## 2024-11-12 MED ORDER — ONDANSETRON 4 MG PO TBDP: 4.0000 mg | ORAL_TABLET | Freq: Three times a day (TID) | ORAL | 0 refills | Status: AC | PRN

## 2024-11-12 MED ORDER — IOHEXOL 300 MG/ML  SOLN
100.0000 mL | Freq: Once | INTRAMUSCULAR | Status: AC | PRN
  Administered 2024-11-12: 100 mL via INTRAVENOUS

## 2024-11-12 MED ORDER — SUCRALFATE 1 G PO TABS
1.0000 g | ORAL_TABLET | Freq: Once | ORAL | Status: AC
  Administered 2024-11-13: 1 g via ORAL
  Filled 2024-11-12: qty 1

## 2024-11-12 MED ORDER — SUCRALFATE 1 GM/10ML PO SUSP: 1.0000 g | Freq: Three times a day (TID) | ORAL | 0 refills | Status: AC

## 2024-11-12 MED ORDER — FENTANYL CITRATE (PF) 50 MCG/ML IJ SOSY
25.0000 ug | PREFILLED_SYRINGE | Freq: Once | INTRAMUSCULAR | Status: AC
  Administered 2024-11-12: 25 ug via INTRAVENOUS
  Filled 2024-11-12: qty 1

## 2024-11-12 MED ORDER — PANTOPRAZOLE SODIUM 40 MG IV SOLR
40.0000 mg | Freq: Once | INTRAVENOUS | Status: AC
  Administered 2024-11-12: 40 mg via INTRAVENOUS
  Filled 2024-11-12: qty 10

## 2024-11-12 NOTE — ED Provider Notes (Incomplete)
 [place] EMERGENCY DEPARTMENT AT [facility] Provider Note   CSN: [id number] Arrival date & time: 11/12/24  1755     Patient presents with: Emesis and Abdominal Pain   Dean Zimmerman is a 73 y.o. male.  {Add pertinent medical, surgical, social history, OB history to HPI:1669} 73 year old male presenting with epigastric abdominal pain, nausea/vomiting.  Symptoms began last night, reports 6 or more episodes of vomiting since last night, no hematemesis.  Reports sharp epigastric abdominal pain, rates this as a 5/10 currently, was an 8/10 at time of presentation to the emergency department this evening.  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  Followed by Dr. Rosalie with [facility] gastroenterology.  Denies chest pain, shortness of breath, diarrhea, dysuria, fever.    Emesis Associated symptoms: abdominal pain   Abdominal Pain Associated symptoms: vomiting        Prior to Admission medications   Medication Sig Start Date End Date Taking? Authorizing Provider  acetaminophen  (TYLENOL ) 500 MG tablet Take 1,000 mg by mouth every 6 (six) hours as needed.    [provider]  aspirin  EC 81 MG tablet Take 1 tablet (81 mg total) by mouth 2 (two) times daily after a meal. Swallow whole. 03/11/23   Lenis Barter, PA-C  atorvastatin  (LIPITOR) 20 MG tablet Take 20 mg by mouth daily.    [provider]  Cholecalciferol (VITAMIN D) 125 MCG (5000 UT) CAPS Take 500 Units by mouth daily.    [provider]  colchicine  0.6 MG tablet Take 1 tablet (0.6 mg total) by mouth 2 (two) times daily. Stop on June 26th, 2024 03/26/23   Alvan Ronal BRAVO, MD  diphenhydrAMINE (BENADRYL) 25 mg capsule Take 50 mg by mouth every 6 (six) hours as needed. With dilaudid     [provider]  enalapril  (VASOTEC ) 10 MG tablet Take 10 mg by mouth daily.    [provider]  escitalopram  (LEXAPRO ) 10 MG  tablet Take 10 mg by mouth daily.    [provider]  FARXIGA 5 MG TABS tablet Take 5 mg by mouth daily. 05/24/21   [provider]  ferrous sulfate 325 (65 FE) MG EC tablet Take 325 mg by mouth daily.    [provider]  HYDROmorphone  (DILAUDID ) 2 MG tablet Take 2 mg by mouth every 6 (six) hours as needed. 03/23/23   [provider]  insulin  glargine (LANTUS ) 100 UNIT/ML injection Inject 10 Units into the skin daily.    [provider]  insulin  lispro (HUMALOG KWIKPEN) 100 UNIT/ML KwikPen Inject 4 Units into the skin 3 (three) times daily before meals. 01/29/23   [provider]  latanoprost  (XALATAN ) 0.005 % ophthalmic solution Place 1 drop into both eyes at bedtime.    [provider]  levothyroxine  (SYNTHROID , LEVOTHROID) 150 MCG tablet Take 150 mcg by mouth daily before breakfast.    [provider]  metFORMIN (GLUCOPHAGE-XR) 500 MG 24 hr tablet Take 1,000 mg by mouth daily with supper.    [provider]  OZEMPIC, 2 MG/DOSE, 8 MG/3ML SOPN Inject 2 mg into the skin every Saturday. 10/08/22   [provider]  pantoprazole  (PROTONIX ) 40 MG tablet 1 tablet twice a day for 1 month, then 1 tablet daily Patient taking differently: 40 mg 2 (two) times daily. 1 tablet twice a day for 1 month, then 1 tablet daily 02/02/23   [name], MD    Allergies:  Hydrocodone, Morphine, and Oxycontin [oxycodone hcl]    Review of Systems  Gastrointestinal:  Positive for abdominal pain and vomiting.    Updated Vital Signs  Vitals:   11/12/24 1804 11/12/24 1805  BP: (!) 157/86   Pulse: 83   Resp: 20   Temp: 98.3 F (36.8 C)   SpO2: 98%   Weight:  117.9 kg  Height:  5' 10 (1.778 m)     Physical Exam Vitals and nursing note reviewed.  HENT:     Head: Normocephalic.  Eyes:     Extraocular Movements: Extraocular movements intact.  Cardiovascular:     Rate and Rhythm: Normal rate.  Pulmonary:     Effort:  Pulmonary effort is normal.  Abdominal:     General: Bowel sounds are normal. There is distension.     Palpations: Abdomen is soft.     Tenderness: There is no abdominal tenderness. There is no guarding.  Musculoskeletal:     Cervical back: Normal range of motion.     Right lower leg: No edema.     Left lower leg: No edema.     Comments: Moves all extremities spontaneously without difficulty  Skin:    General: Skin is warm and dry.  Neurological:     Mental Status: He is alert and oriented to person, place, and time.     (all labs ordered are listed, but only abnormal results are displayed) Labs Reviewed  LIPASE, BLOOD - Abnormal; Notable for the following components:      Result Value   Lipase 61 (*)    All other components within normal limits  COMPREHENSIVE METABOLIC PANEL WITH GFR - Abnormal; Notable for the following components:   Glucose, Bld 217 (*)    Calcium  10.8 (*)    Total Bilirubin 1.4 (*)    All other components within normal limits  CBC - Abnormal; Notable for the following components:   WBC 11.5 (*)    All other components within normal limits  URINALYSIS, ROUTINE W REFLEX MICROSCOPIC - Abnormal; Notable for the following components:   Specific Gravity, Urine 1.036 (*)    Glucose, UA >1,000 (*)    Hgb urine dipstick MODERATE (*)    Ketones, ur >80 (*)    All other components within normal limits  TROPONIN T, HIGH SENSITIVITY - Abnormal; Notable for the following components:   Troponin T High Sensitivity 25 (*)    All other components within normal limits  CBG MONITORING, ED    EKG: None  Radiology: No results found.  {Document cardiac monitor, telemetry assessment procedure when appropriate:32947} Procedures   Medications Ordered in the ED  fentaNYL  (SUBLIMAZE ) injection 25 mcg (has no administration in time range)  ondansetron  (ZOFRAN ) injection 4 mg (has no administration in time range)      {Click here for ABCD2, HEART and other calculators  REFRESH Note before signing:1}                              Medical Decision Making This patient presents to the ED for concern of ***, this involves an extensive number of treatment options, and is a complaint that carries with it a high risk of complications and morbidity.  The differential diagnosis includes ***   Co morbidities that complicate the patient evaluation  ***   Additional history obtained:  Additional history obtained from *** External records from outside source obtained and reviewed including ***  Lab Tests:  I Ordered, and personally interpreted labs.  The pertinent results include: CBC notable for borderline leukocytosis with white blood cell count of 11.5.  CMP notable for hyperglycemia, total bilirubin is mildly elevated at 1.4, borderline hypercalcemia at 10.8, otherwise unremarkable.  Urinalysis notable for moderate RBCs with glucosuria and ketones, high specific gravity suggestive of dehydration.  Initial troponin 25, repeat***.  Lipase elevated at 61.   Imaging Studies ordered:  I ordered imaging studies including CT abdomen/pelvis  I independently visualized and interpreted imaging which showed . Mild stranding along the pancreaticoduodenal groove, suggesting mild groove pancreatitis; duodenitis or duodenal ulcer is also a consideration. 2. Mild gastric distention.  I agree with the radiologist interpretation   Cardiac Monitoring: / EKG:  The patient was maintained on a cardiac monitor.  I personally viewed and interpreted the cardiac monitored which showed an underlying rhythm of: ***   Consultations Obtained:  I requested consultation with the ***,  and discussed lab and imaging findings as well as pertinent plan - they recommend: ***   Problem List / ED Course / Critical interventions / Medication management  *** I ordered medication including ***  for ***  Reevaluation of the patient after these medicines showed that the patient  {resolved/improved/worsened:23923::improved} I have reviewed the patients home medicines and have made adjustments as needed   Social Determinants of Health:  ***   Test / Admission - Considered:  *** Plan of care discussed with Dr. Ellouise  Amount and/or Complexity of Data Reviewed Labs: ordered. Radiology: ordered.  Risk Prescription drug management.   ***  {Document critical care time when appropriate  Document review of labs and clinical decision tools ie CHADS2VASC2, etc  Document your independent review of radiology images and any outside records  Document your discussion with family members, caretakers and with consultants  Document social determinants of health affecting pt's care  Document your decision making why or why not admission, treatments were needed:32947:::1}   Final diagnoses:  None    ED Discharge Orders     None

## 2024-11-12 NOTE — ED Notes (Signed)
 Patient notified of need for urine sample to complete eval/assessment. Specimen cup provided and instructions for clean catch given. Patient will notify staff when able to provide

## 2024-11-12 NOTE — ED Provider Notes (Signed)
 [facility] [place] [facility]PARTMENT AT [facility] Provider Note   CSN: [id number] Arrival date & time: 11/12/24  1755     Patient presents with: Emesis and Abdominal Pain   Dean Zimmerman is a 73 y.o. male.   73 year old male presenting with epigastric abdominal pain, nausea/vomiting.  Symptoms began last night, reports 6 or more episodes of vomiting since last night, no hematemesis.  Reports sharp epigastric abdominal pain, rates this as a 5/10 currently, was an 8/10 at time of presentation to the emergency department this evening.  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  Followed by Dr. Rosalie with [facility] gastroenterology.  Denies chest pain, shortness of breath, diarrhea, dysuria, fever.    Emesis Associated symptoms: abdominal pain   Abdominal Pain Associated symptoms: vomiting        Prior to Admission medications   Medication Sig Start Date End Date Taking? Authorizing Provider  acetaminophen  (TYLENOL ) 500 MG tablet Take 1,000 mg by mouth every 6 (six) hours as needed.    [provider]  aspirin  EC 81 MG tablet Take 1 tablet (81 mg total) by mouth 2 (two) times daily after a meal. Swallow whole. 03/11/23   Lenis Barter, PA-C  atorvastatin  (LIPITOR) 20 MG tablet Take 20 mg by mouth daily.    [provider]  Cholecalciferol (VITAMIN D) 125 MCG (5000 UT) CAPS Take 500 Units by mouth daily.    [provider]  colchicine  0.6 MG tablet Take 1 tablet (0.6 mg total) by mouth 2 (two) times daily. Stop on June 26th, 2024 03/26/23   Alvan Ronal BRAVO, MD  diphenhydrAMINE (BENADRYL) 25 mg capsule Take 50 mg by mouth every 6 (six) hours as needed. With dilaudid     [provider]  enalapril  (VASOTEC ) 10 MG tablet Take 10 mg by mouth daily.    [provider]  escitalopram  (LEXAPRO ) 10 MG tablet Take 10 mg by mouth daily.    [provider]  FARXIGA 5  MG TABS tablet Take 5 mg by mouth daily. 05/24/21   [provider]  ferrous sulfate 325 (65 FE) MG EC tablet Take 325 mg by mouth daily.    [provider]  HYDROmorphone  (DILAUDID ) 2 MG tablet Take 2 mg by mouth every 6 (six) hours as needed. 03/23/23   [provider]  insulin  glargine (LANTUS ) 100 UNIT/ML injection Inject 10 Units into the skin daily.    [provider]  insulin  lispro (HUMALOG KWIKPEN) 100 UNIT/ML KwikPen Inject 4 Units into the skin 3 (three) times daily before meals. 01/29/23   [provider]  latanoprost  (XALATAN ) 0.005 % ophthalmic solution Place 1 drop into both eyes at bedtime.    [provider]  levothyroxine  (SYNTHROID , LEVOTHROID) 150 MCG tablet Take 150 mcg by mouth daily before breakfast.    [provider]  metFORMIN (GLUCOPHAGE-XR) 500 MG 24 hr tablet Take 1,000 mg by mouth daily with supper.    [provider]  OZEMPIC, 2 MG/DOSE, 8 MG/3ML SOPN Inject 2 mg into the skin every Saturday. 10/08/22   [provider]  pantoprazole  (PROTONIX ) 40 MG tablet 1 tablet twice a day for 1 month, then 1 tablet daily Patient taking differently: 40 mg 2 (two) times daily. 1 tablet twice a day for 1 month, then 1 tablet daily 02/02/23   [name], MD    Allergies: Hydrocodone, Morphine, and Oxycontin [oxycodone hcl]  Review of Systems  Gastrointestinal:  Positive for abdominal pain and vomiting.    Updated Vital Signs  Vitals:   11/12/24 1804 11/12/24 1805  BP: (!) 157/86   Pulse: 83   Resp: 20   Temp: 98.3 F (36.8 C)   SpO2: 98%   Weight:  117.9 kg  Height:  5' 10 (1.778 m)     Physical Exam Vitals and nursing note reviewed.  HENT:     Head: Normocephalic.  Eyes:     Extraocular Movements: Extraocular movements intact.  Cardiovascular:     Rate and Rhythm: Normal rate.  Pulmonary:     Effort: Pulmonary effort is normal.  Abdominal:     General: Bowel sounds are  normal.     Palpations: Abdomen is soft.     Tenderness: There is no abdominal tenderness. There is no guarding.  Musculoskeletal:     Cervical back: Normal range of motion.     Right lower leg: No edema.     Left lower leg: No edema.     Comments: Moves all extremities spontaneously without difficulty  Skin:    General: Skin is warm and dry.  Neurological:     Mental Status: He is alert and oriented to person, place, and time.     (all labs ordered are listed, but only abnormal results are displayed) Labs Reviewed  LIPASE, BLOOD - Abnormal; Notable for the following components:      Result Value   Lipase 61 (*)    All other components within normal limits  COMPREHENSIVE METABOLIC PANEL WITH GFR - Abnormal; Notable for the following components:   Glucose, Bld 217 (*)    Calcium  10.8 (*)    Total Bilirubin 1.4 (*)    All other components within normal limits  CBC - Abnormal; Notable for the following components:   WBC 11.5 (*)    All other components within normal limits  URINALYSIS, ROUTINE W REFLEX MICROSCOPIC - Abnormal; Notable for the following components:   Specific Gravity, Urine 1.036 (*)    Glucose, UA >1,000 (*)    Hgb urine dipstick MODERATE (*)    Ketones, ur >80 (*)    All other components within normal limits  TROPONIN T, HIGH SENSITIVITY - Abnormal; Notable for the following components:   Troponin T High Sensitivity 25 (*)    All other components within normal limits  CBG MONITORING, ED    EKG: None  Radiology: No results found.   Procedures   Medications Ordered in the ED  fentaNYL  (SUBLIMAZE ) injection 25 mcg (has no administration in time range)  ondansetron  (ZOFRAN ) injection 4 mg (has no administration in time range)                                    Medical Decision Making This patient presents to the ED for concern of nausea/vomiting/abdominal pain, this involves an extensive number of treatment options, and is a complaint that carries  with it a high risk of complications and morbidity.  The differential diagnosis includes gastroenteritis, peptic ulcer disease, pancreatitis, SBO   Co morbidities that complicate the patient evaluation  History of duodenal ulcers   Additional history obtained:  Additional history obtained from record review External records from outside source obtained and reviewed including prior ED note   Lab Tests:  I Ordered, and personally interpreted labs.  The pertinent results include: CBC notable for borderline  leukocytosis with white blood cell count of 11.5.  CMP notable for hyperglycemia, total bilirubin is mildly elevated at 1.4, borderline hypercalcemia at 10.8, otherwise unremarkable.  Urinalysis notable for moderate RBCs with glucosuria and ketones, high specific gravity suggestive of dehydration.  Initial troponin 25, repeat remains flat at 24.  Lipase elevated at 61.   Imaging Studies ordered:  I ordered imaging studies including CT abdomen/pelvis  I independently visualized and interpreted imaging which showed . Mild stranding along the pancreaticoduodenal groove, suggesting mild groove pancreatitis; duodenitis or duodenal ulcer is also a consideration. 2. Mild gastric distention.  I agree with the radiologist interpretation   Cardiac Monitoring: / EKG:  The patient was maintained on a cardiac monitor.  I personally viewed and interpreted the cardiac monitored which showed an underlying rhythm of: NSR   Problem List / ED Course / Critical interventions / Medication management  I ordered medication including morphine for pain, Zofran  for nausea, Protonix  for suspected duodenal ulcer, Carafate for suspected duodenal ulcer Reevaluation of the patient after these medicines showed that the patient improved I have reviewed the patients home medicines and have made adjustments as needed   Social Determinants of Health:  Former tobacco use   Test / Admission -  Considered:  Physical exam is notable as above.  Patient does appear to have some abdominal distention but otherwise abdominal exam is reassuring, no tenderness to palpation, normal bowel sounds.  Patient with history of duodenal ulcer, was previously on Protonix  but admits that he discontinued this medication a while ago.  Concern for recurrence of this given his symptoms today, we will proceed with CT scan. CT scan notable as above, low suspicion for pancreatitis given minimally elevated lipase, symptoms today seem most reflective of recurrence of his duodenal ulcer.  Hemoglobin is largely stable from previous, I do not suspect that he has a perforated ulcer at this time.  He is well-appearing and in no acute distress. Troponin minimally elevated at 25 and 24 respectively, I suspect that this is patient's baseline as he is not currently experiencing chest pain or shortness of breath, low suspicion for ACS at this time.  Patient has been given pain medication and antiemetics as well as Protonix /Carafate prior to discharge, patient was able to tolerate p.o. without additional episodes of vomiting.  Patient is already well-established with Dr. Rosalie, I recommend that he contact their office Monday to schedule a sooner follow-up, as his next appointment is in December.  Will have patient restart Protonix  and Carafate, will also provide him with a prescription for Zofran  to be used as needed for nausea.  Strict return precautions discussed, he and his wife are in agreement with this plan and he is appropriate for discharge at this time. Plan of care discussed with Dr. Ellouise  Amount and/or Complexity of Data Reviewed Labs: ordered. Radiology: ordered.  Risk Prescription drug management.        Final diagnoses:  Epigastric pain  Nausea and vomiting, unspecified vomiting type    ED Discharge Orders          Ordered    pantoprazole  (PROTONIX ) 40 MG tablet  2 times daily        11/12/24  2356    sucralfate (CARAFATE) 1 GM/10ML suspension  3 times daily with meals & bedtime        11/12/24 2356    ondansetron  (ZOFRAN -ODT) 4 MG disintegrating tablet  Every 8 hours PRN        11/12/24 2356  Glendia Rocky SAILOR, PA-C 11/13/24 0016    Kingsley, Victoria K, DO 11/13/24 1501

## 2024-11-12 NOTE — ED Triage Notes (Signed)
 Pt POV reporting mid upper abd pain and multiple episodes of emesis since last night, hx duodenal ulcer and bowel obstruction, last BM last night.

## 2024-11-12 NOTE — Discharge Instructions (Signed)
 Your symptoms today are most likely due to a recurrence of your duodenal ulcer, restart Protonix  1 tablet by mouth twice daily, start Carafate and use as directed.  Continue Zofran  every 8 hours as needed for nausea.  Avoid NSAIDs.  Contact your gastroenterologist office to schedule a follow-up in regard to your symptoms.  Return to the emergency department if your symptoms worsen.
